# Patient Record
Sex: Female | Born: 1980 | Hispanic: No | Marital: Married | Smoking: Never smoker
Health system: Southern US, Community
[De-identification: ages and names within clinical notes are randomized; demographics above are authoritative.]

## PROBLEM LIST (undated history)

## (undated) DIAGNOSIS — R55 Syncope and collapse: Secondary | ICD-10-CM

## (undated) HISTORY — DX: Syncope and collapse: R55

## (undated) HISTORY — PX: WISDOM TOOTH EXTRACTION: SHX21

## (undated) HISTORY — PX: TUBAL LIGATION: SHX77

## (undated) HISTORY — PX: TONSILLECTOMY: SUR1361

---

## 2006-11-28 ENCOUNTER — Inpatient Hospital Stay: Payer: Self-pay | Admitting: Obstetrics and Gynecology

## 2006-12-02 ENCOUNTER — Ambulatory Visit: Payer: Self-pay | Admitting: Pediatrics

## 2008-02-05 ENCOUNTER — Ambulatory Visit: Payer: Self-pay | Admitting: Family Medicine

## 2009-08-11 ENCOUNTER — Inpatient Hospital Stay: Payer: Self-pay | Admitting: Obstetrics and Gynecology

## 2009-08-17 ENCOUNTER — Ambulatory Visit: Payer: Self-pay | Admitting: Obstetrics and Gynecology

## 2010-05-29 ENCOUNTER — Encounter: Payer: Self-pay | Admitting: Cardiology

## 2010-06-11 ENCOUNTER — Ambulatory Visit: Payer: Self-pay | Admitting: Family Medicine

## 2010-06-11 ENCOUNTER — Encounter: Payer: Self-pay | Admitting: Cardiology

## 2010-06-18 ENCOUNTER — Encounter: Payer: Self-pay | Admitting: Cardiology

## 2010-06-18 ENCOUNTER — Ambulatory Visit: Payer: Self-pay | Admitting: Cardiology

## 2010-06-18 DIAGNOSIS — R55 Syncope and collapse: Secondary | ICD-10-CM

## 2010-06-19 ENCOUNTER — Encounter: Payer: Self-pay | Admitting: Cardiovascular Disease

## 2010-06-19 ENCOUNTER — Ambulatory Visit: Payer: Self-pay | Admitting: Cardiology

## 2010-06-19 ENCOUNTER — Ambulatory Visit: Payer: Self-pay

## 2010-07-03 ENCOUNTER — Ambulatory Visit: Payer: Self-pay | Admitting: Cardiology

## 2010-09-25 NOTE — Assessment & Plan Note (Signed)
Summary: NP6/AMD   Visit Type:  Initial Consult Primary Provider:  Noralee Stain, M.D.  CC:  c/o fainting spells 3 x in the past year.  She states has rapid heart beats prior to when she may faint.  .  History of Present Illness: 30 yo with minimal past medical history presents for evaluation of syncopal episodes.  Patient states that when she was in middle school and high school, she had episodes of syncope.  She does not remember what triggered them but says she tended to be "sick" when it would occur.  She did not have any further episodes in her 67s until this year.  She has had 3 syncopal episodes over the last year.  Each time, she would develop stomach cramping and nausea and would go to the bathroom for a bowel movement.  She would pass out while sitting on the toilet.  She would feel flushed and lightheaded prior to passing out but no palpitations or racing heart beat.  She was pregnant during the first episode.  She cannot think of anything in particular that would have brought on the GI symptoms, and says that after she would recover from passing out, she would vomit and then tend to feel ok.  Episodes were not related to meals. Besides those three episodes this year, she occasionally gets mildly lightheaded if she stands too fast.  She had a RUQ ultrasound recently with unremarkable gallbladder.   Labs (10/11): TSH normal, K 4.3, creatinine 0.73  ECG: NSR, normal  Preventive Screening-Counseling & Management  Caffeine-Diet-Exercise     Does Patient Exercise: no      Drug Use:  no.    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   Vital Signs:  Patient profile:   30 year old female Height:      65 inches Weight:      187 pounds BMI:     31.23 BP supine:   110 / 68  (left arm) BP sitting:   110 / 72  (left arm) BP standing:   110 / 68  (left arm) Cuff size:   large  Vitals Entered By: Bishop Dublin, CMA (June 18, 2010 3:35 PM)  Physical Exam  General:  Well developed, well nourished, in no acute distress. Mildly obese.  Head:  normocephalic and atraumatic Nose:  no deformity, discharge, inflammation, or lesions Mouth:  Teeth, gums and palate normal. Oral mucosa normal. Neck:  Neck supple, no JVD. No masses, thyromegaly or abnormal cervical nodes. Lungs:  Clear bilaterally to auscultation and percussion. Heart:  Non-displaced PMI, chest non-tender; regular rate and rhythm, S1, S2 without murmurs, rubs or gallops. Carotid upstroke normal, no bruit.  Pedals normal pulses. No edema, no varicosities. Abdomen:  Bowel sounds positive; abdomen soft and non-tender without masses, organomegaly, or hernias noted. No hepatosplenomegaly. Msk:  Back normal, normal gait. Muscle strength and tone normal. Extremities:  No clubbing or cyanosis. Neurologic:  Alert and oriented x 3. Skin:  Intact without lesions or rashes. Psych:  Normal affect.   Impression & Recommendations:  Problem # 1:  SYNCOPE (ICD-780.2) Episodes of syncope sound vasovagal (occur with defecation, one of the classic  situations that trigger vasovagal syncope).  I am unsure what is causing the episodes of stomach cramps that seem to trigger these episodes.  ECG and heart exam are normal.  No palpitations or episodes of racing heart rate.  Will get an echo to make sure the heart is structurally normal and a 48 hour holter to make sure no significant arrhythmias are detected.  I talked to her about physical counterpressure measures that sometimes can abort pending vasovagal syncope.  If this cardiac workup is negative, suspect symptoms are vagal.   Other Orders: Echocardiogram (Echo) Holter Monitor (Holter Monitor)  Patient Instructions: 1)  Your physician recommends that you schedule a follow-up appointment in: Follow-up 2 weeks 2)  Your physician has requested that you have an echocardiogram.  Echocardiography is a painless test that uses sound waves to create images of your heart. It provides your doctor with information about the size and shape of your heart and how well your heart's chambers and valves are working.  This procedure takes approximately one hour. There are no restrictions for this procedure. 3)  Your physician has recommended that you wear a holter monitor.  Holter monitors are medical devices that record the heart's electrical activity. Doctors most often use these monitors to diagnose arrhythmias. Arrhythmias are problems with the speed or rhythm of the heartbeat. The monitor is a small, portable device. You can wear one while you do your normal daily activities. This is usually used to diagnose what is causing palpitations/syncope (passing out).

## 2010-09-25 NOTE — Assessment & Plan Note (Signed)
Summary: F2W/AMD   Visit Type:  Follow-up Primary Provider:  Noralee Stain, M.D.  CC:  Has not had any more episodes of syncope.Marland Kitchen  History of Present Illness: 30 yo with minimal past medical history returns for followup of syncopal episodes.  Patient states that when she was in middle school and high school, she had episodes of syncope.  She does not remember what triggered them but says she tended to be "sick" when it would occur.  She did not have any further episodes in her 83s until this year.  She has had 3 syncopal episodes over the last year.  Each time, she would develop stomach cramping and nausea and would go to the bathroom for a bowel movement.  She would pass out while sitting on the toilet.  She would feel flushed and lightheaded prior to passing out but no palpitations or racing heart beat.  She was pregnant during the first episode.  She cannot think of anything in particular that would have brought on the GI symptoms, and says that after she would recover from passing out, she would vomit and then tend to feel ok.  Episodes were not related to meals. Besides those three episodes this year, she occasionally gets mildly lightheaded if she stands too fast.  She had a RUQ ultrasound recently with unremarkable gallbladder.   Since I last saw her, she has had no further symptoms.  Echo showed normal LV systolic and diastolic function, no pulmonary hypertension, and normal valves.  Holter monitor showed occasional runs of ectopic atrial rhythm in the 50s but worrisome arrhythmias.    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  Normal ECG (normal QTc).  Echo (10/11): EF 55-60%, normal diastolic function, normal wall motion, no significant valvular abnormalities, no pulmonary hypertension, normal-appearing RV.  Holter (10/11): Occasional ectopic atrial rhythm with rate in 50s, no worrisome events.  Average HR 83.   Family History: Reviewed history from 06/18/2010 and no changes required. Family History of Diabetes: Mother (Mother's side of family) Grandfather with "heart trouble"  No early CAD, congenital heart disease, or sudden death  Social History: Reviewed history from 06/18/2010 and no changes required. 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 Mildly obese.  Neck:  Neck supple, no JVD. No masses, thyromegaly or abnormal cervical nodes. Lungs:  Clear bilaterally to auscultation and percussion.  Heart:  Non-displaced PMI, chest non-tender; regular rate and rhythm, S1, S2 without murmurs, rubs or gallops. Carotid upstroke normal, no bruit. Pedals normal pulses. No edema, no varicosities. Abdomen:  Bowel sounds positive; abdomen soft and non-tender without masses, organomegaly, or hernias noted. No hepatosplenomegaly. Extremities:  No clubbing or cyanosis. Neurologic:  Alert and  oriented x 3. Psych:  Normal affect.   Impression & Recommendations:  Problem # 1:  SYNCOPE (ICD-780.2) I suspect that the syncopal episodes are vasovagal (occur with defecation, one of the classic situations that trigger vasovagal syncope).  I am unsure what is causing the episodes of stomach cramps that seem to trigger these episodes.  ECG and heart exam are normal.  No palpitations or episodes of racing heart rate. Heart was structurally normal on echo and there were no worrisome arrhythmias on 48 hour holter monitoring.  If episodes become more frequent, would probably have to do a 3 week event monitor to definitively rule out arrhythmic etiology, but I doubt that this is the case.  I talked again to her about physical counterpressure measures that sometimes can abort pending vasovagal syncope.    Patient Instructions: 1)  Your physician recommends that you schedule a follow-up appointment as needed.

## 2010-09-25 NOTE — Assessment & Plan Note (Signed)
Summary: HOLTER/AMD  Nurse Visit

## 2010-09-25 NOTE — Procedures (Signed)
Summary: Summary Report  Summary Report   Imported By: Erle Crocker 07/20/2010 09:02:42  _____________________________________________________________________  External Attachment:    Type:   Image     Comment:   External Document

## 2011-01-21 ENCOUNTER — Ambulatory Visit: Payer: Self-pay | Admitting: Internal Medicine

## 2011-03-19 ENCOUNTER — Encounter: Payer: Self-pay | Admitting: Cardiology

## 2011-06-14 IMAGING — US ABDOMEN ULTRASOUND LIMITED
1 series · 17 of 25 positions shown · non-contrast
Comparison: none

REASON FOR EXAM: ABD PAIN NAUSEA
COMMENTS:

[Series 1: abdomen ultrasound limited · 17 of 82 slices shown]
[im 1/82]
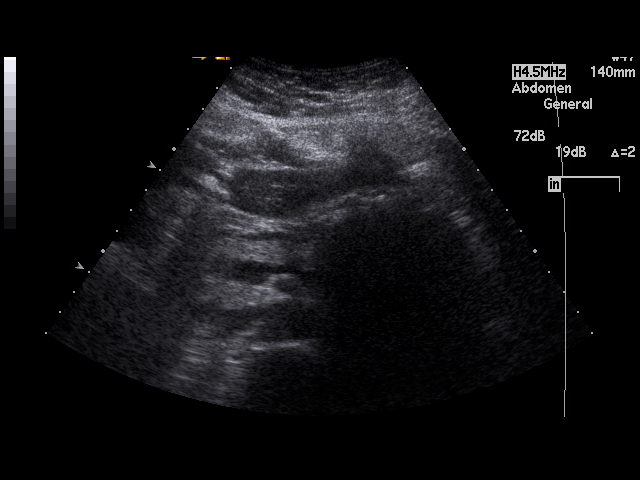
[im 7/82]
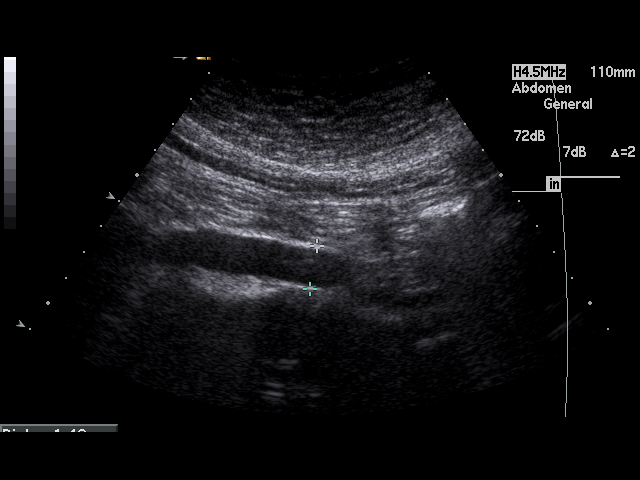
[im 11/82]
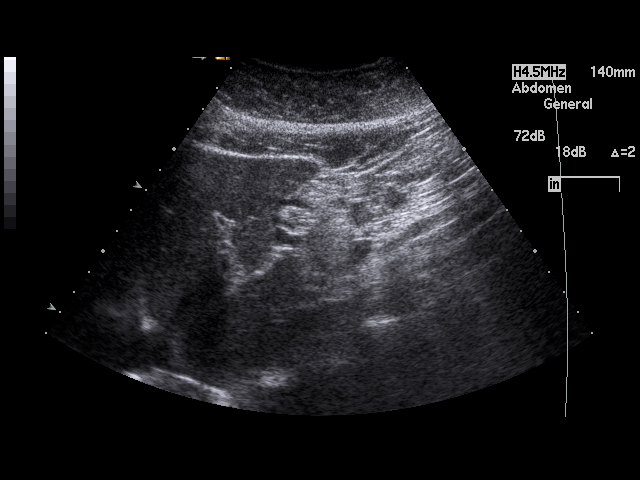
[im 17/82]
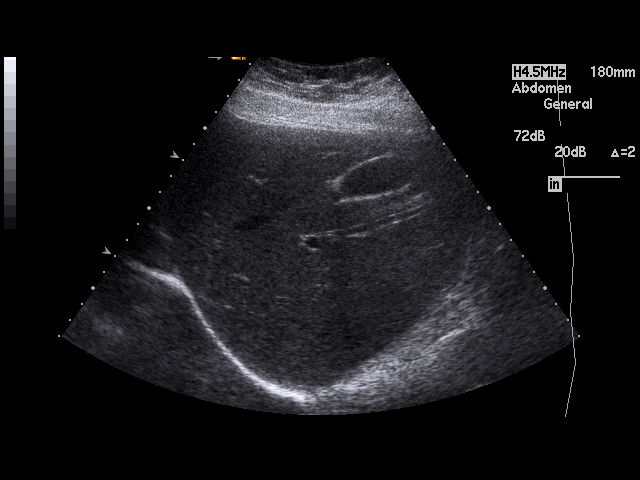
[im 21/82]
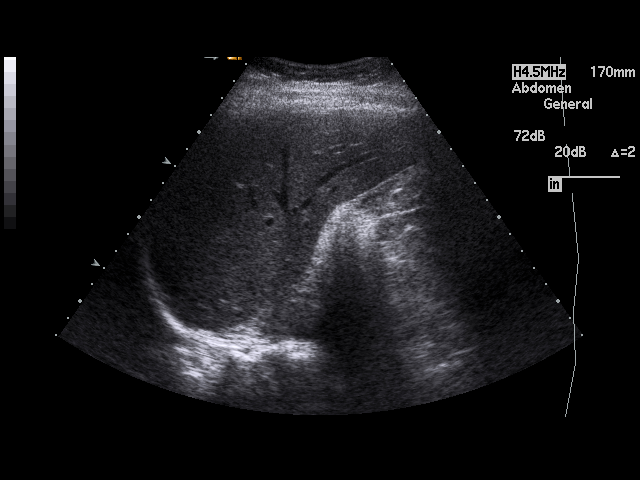
[im 28/82]
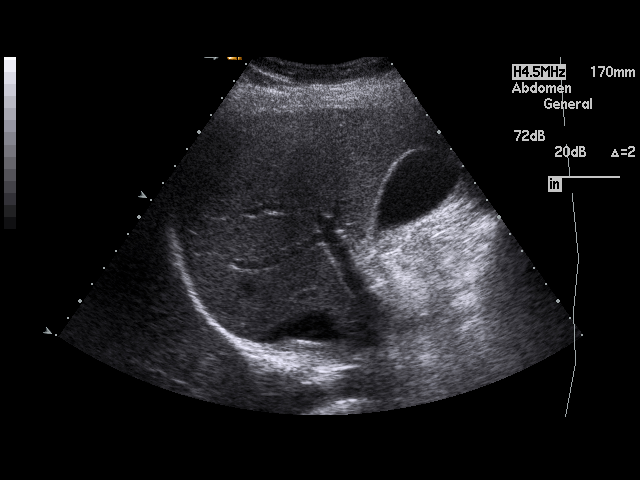
[im 31/82]
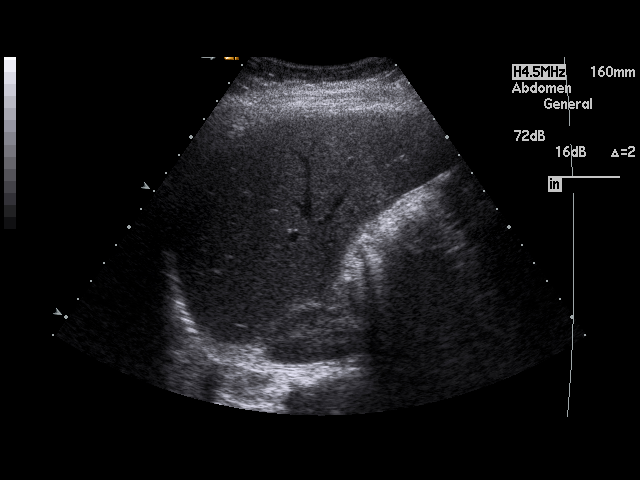
[im 38/82]
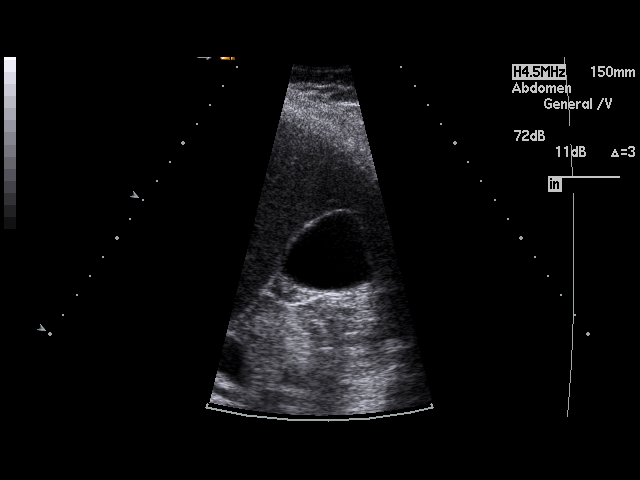
[im 41/82]
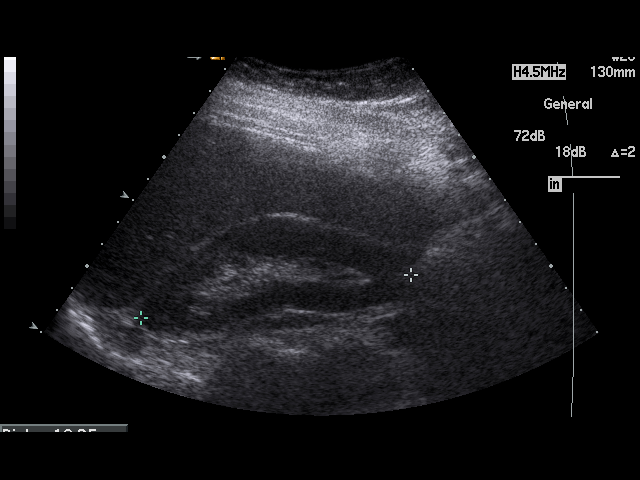
[im 44/82]
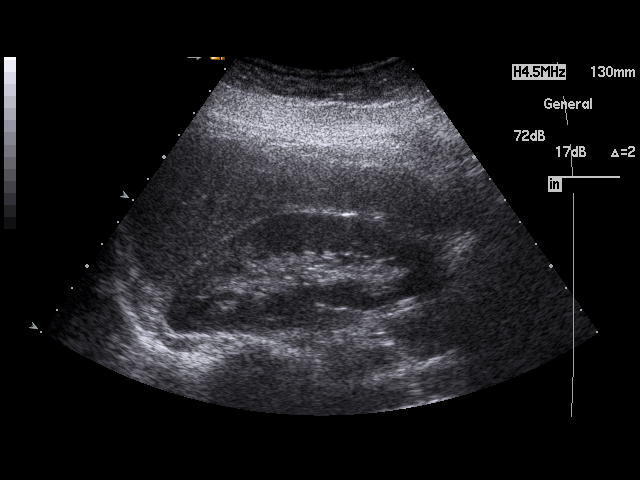
[im 51/82]
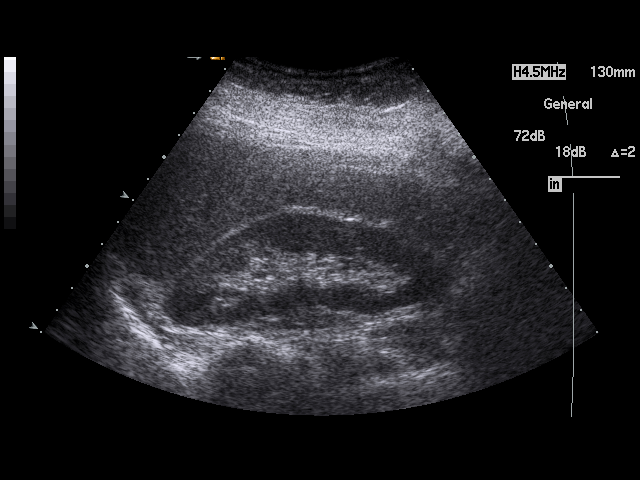
[im 55/82]
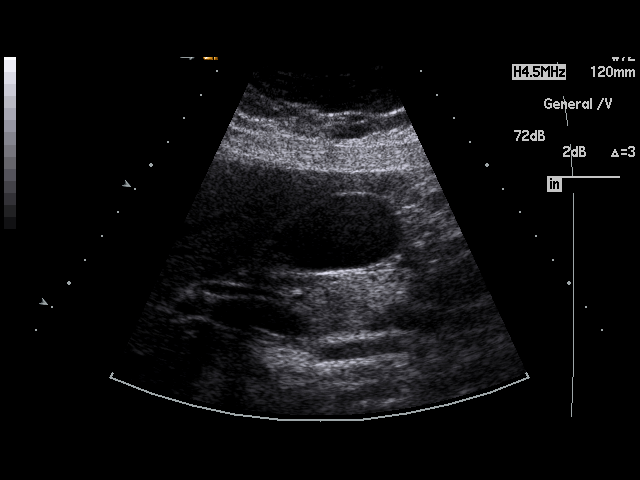
[im 61/82]
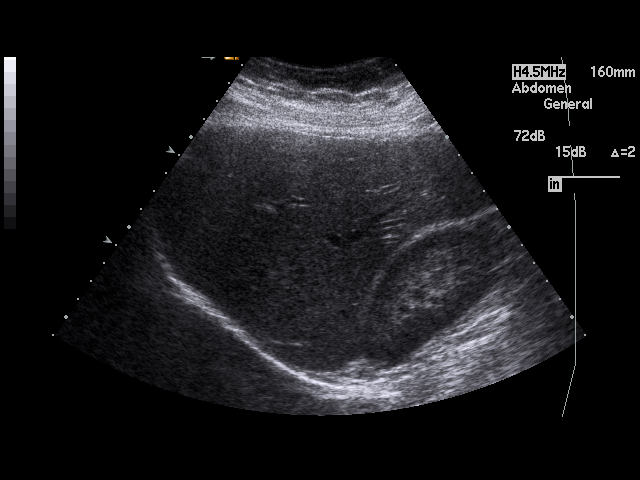
[im 65/82]
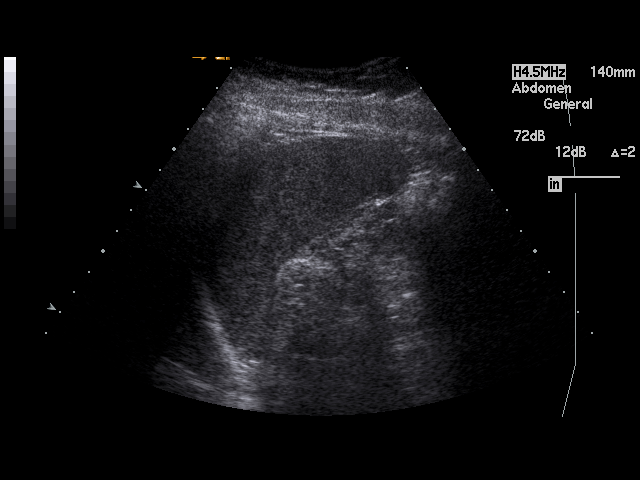
[im 71/82]
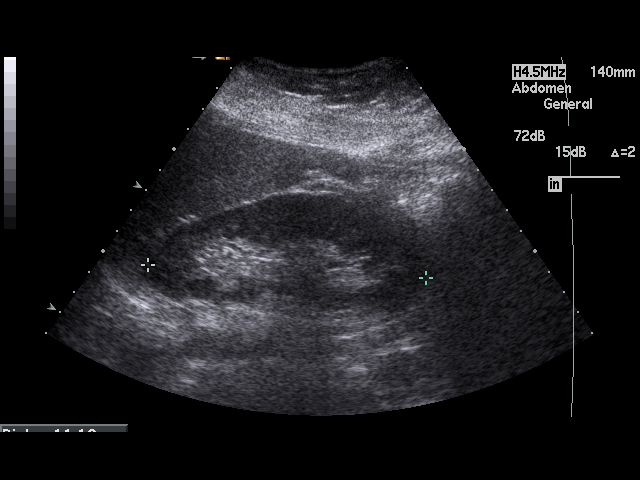
[im 75/82]
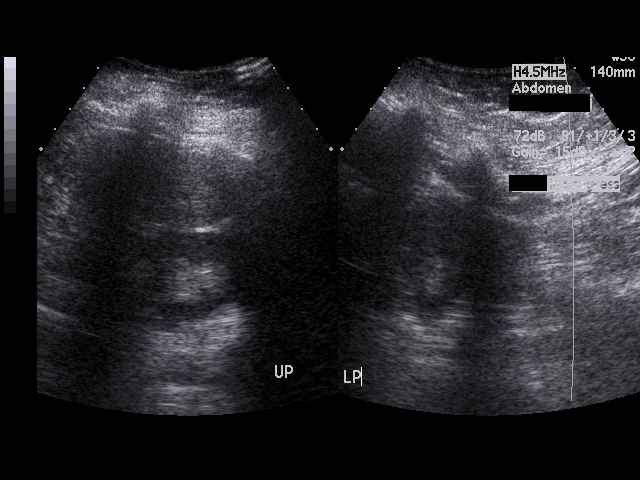
[im 82/82]
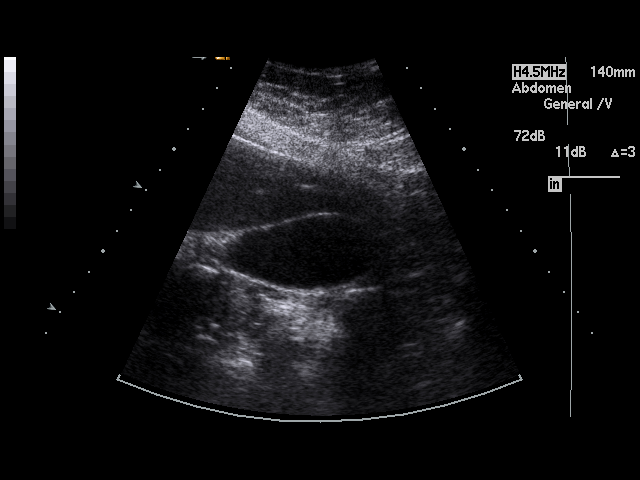

[17 of 25 positions shown; findings below may reference images not displayed]

PROCEDURE:     DON LOLITO - DON LOLITO ABDOMEN UPPER GENERAL  - June 11, 2010  [DATE]

RESULT:     The liver, spleen, abdominal aorta and inferior vena cava show
no significant abnormalities. A portion of the pancreatic tail is obscured
by bowel gas but otherwise the pancreas is normal in appearance. No
gallstones are seen. There is no thickening of the gallbladder wall. Common
bile duct measures 3.8 mm at maximum diameter which is within normal limits.
The kidneys show no hydronephrosis. The right kidney measures 10.38 cm
sagittally and the left kidney measures 11.19 cm sagittally. No ascites is
seen.
IMPRESSION: No significant abnormalities are noted.

## 2011-07-30 ENCOUNTER — Ambulatory Visit: Payer: Self-pay

## 2013-06-28 ENCOUNTER — Ambulatory Visit: Payer: Self-pay | Admitting: Obstetrics and Gynecology

## 2013-07-26 ENCOUNTER — Ambulatory Visit: Payer: Self-pay | Admitting: Obstetrics and Gynecology

## 2013-08-18 ENCOUNTER — Ambulatory Visit: Payer: Self-pay | Admitting: Obstetrics and Gynecology

## 2013-08-18 LAB — CBC WITH DIFFERENTIAL/PLATELET
Basophil #: 0 10*3/uL (ref 0.0–0.1)
Basophil %: 0.4 %
Eosinophil #: 0.1 10*3/uL (ref 0.0–0.7)
HGB: 11.7 g/dL — ABNORMAL LOW (ref 12.0–16.0)
Lymphocyte #: 2 10*3/uL (ref 1.0–3.6)
Lymphocyte %: 22.4 %
MCH: 28.2 pg (ref 26.0–34.0)
MCV: 90 fL (ref 80–100)
Monocyte #: 0.5 x10 3/mm (ref 0.2–0.9)
Monocyte %: 5.9 %
Neutrophil #: 6.3 10*3/uL (ref 1.4–6.5)
Neutrophil %: 70.5 %

## 2013-08-21 ENCOUNTER — Inpatient Hospital Stay: Payer: Self-pay | Admitting: Obstetrics and Gynecology

## 2014-12-16 NOTE — Op Note (Signed)
PATIENT NAME:  Mallory Clayton, Mallory Clayton MR#:  [id number] DATE OF BIRTH:  [birth year]  DATE OF PROCEDURE:  08/21/2013  PREOPERATIVE DIAGNOSES:  1. A 39.3-week intrauterine pregnancy undelivered.  2. Gestational diabetes mellitus, diet controlled.  3. History of postpartum hemorrhage. 4. History of prior fetal macrosomia.   POSTOPERATIVE DIAGNOSES:  1. A 39.3-week intrauterine pregnancy, delivered.  2. Gestational diabetes mellitus, diet controlled.  3. History of postpartum hemorrhage.  4. Prior fetal macrosomia.  5. Viable female, 9 pounds 10 ounces.   OPERATIVE PROCEDURE: Primary low cervical transverse cesarean section.   SURGEON: Prentice Docker. Briceida Rasberry, MD  FIRST ASSISTANT: None.   ANESTHESIA: Spinal.   INDICATIONS: The patient is a 34 year old white female, gravida 4, para 2-0-1-2 at 21.3 weeks' gestation who presents for elective primary low cervical transverse cesarean section. The patient had decided to opt out of vaginal childbirth due to her past history of fetal macrosomia with postpartum hemorrhage at vaginal delivery secondary to laceration of multiple vulvar varicosities. The patient also had history of fetal macrosomia. This infant is large by Leopold's as well as estimated fetal weight by ultrasound 2 weeks ago. She also has gestational diabetes, which was diet controlled.   FINDINGS AT SURGERY: Revealed a viable female infant, 9 pounds 10 ounces, having Apgars of 7 and 9 at one and five minutes, respectively. Uterus, tubes and ovaries were grossly normal.   DESCRIPTION OF THE PROCEDURE: The patient was brought to the operating room, where she was placed in the sitting position. Spinal anesthetic was introduced without difficulty. The patient was placed in the supine position with a right lateral hip roll placed. A ChloraPrep abdominal and perineal prep and drape was performed in standard fashion. Foley catheter had been placed and was draining clear yellow urine. After checking for  adequate level of anesthesia, a Pfannenstiel incision was made in the abdomen. The fascia was incised transversely. The midline raphe was incised, separated and the peritoneum was entered. Bladder flap was created through the lower uterine segment with Metzenbaum's. Low transverse incision was made in the uterus, and this was extended bluntly in a cephalad and caudad direction through expansion. The infant was delivered through a vertex presentation. He was vigorous at birth. The oropharynx and nasopharynx were bulb suctioned on the abdominal wall. The infant was delivered in a standard technique. The umbilical cord was doubly clamped and cut, and the infant was handed off to the awaiting resuscitation team. The cord blood sampling was obtained. Placenta was expressed from the uterus. The uterus was externalized onto the anterior abdominal wall and was cleared of all debris with laps. The incision was closed in 2 layers using #1 chromic suture. First layer was a running locking stitch. The second layer was an imbricating layer. The uterus, tubes and ovaries were noted to be grossly normal. The uterus was placed back into the abdominopelvic cavity. Gutters were cleared of all debris with laps. The incision was then closed in layers with 0 Maxon being used on the fascia in a simple running manner. The subcutaneous tissues were reapproximated using 2-0 Vicryl continuous suture. The skin was closed with a 4-0 Vicryl continuous running closure. Dermabond glue was placed over the incision. A pressure dressing was placed. The patient was then mobilized and taken to the recovery room in satisfactory condition.   ESTIMATED BLOOD LOSS: 750 mL.   INTRAVENOUS FLUIDS: 1800 mL of crystalloid.   URINE OUTPUT: 250 mL.   ANTIBIOTICS: The patient did receive Ancef antibiotic prophylaxis.  COUNTS: All instrument, needle and sponge counts were verified as correct.   ____________________________ Prentice DockerMartin A. Alveta Quintela,  MD mad:lb D: 08/21/2013 09:16:09 ET T: 08/21/2013 09:32:39 ET JOB#: [id number]  cc: Daphine DeutscherMartin A. Josia Cueva, MD, <Dictator> [facility] Prentice DockerMARTIN A Carlisle Enke MD ELECTRONICALLY SIGNED 08/21/2013 10:43

## 2015-05-09 ENCOUNTER — Encounter: Payer: Self-pay | Admitting: Obstetrics and Gynecology

## 2015-05-09 ENCOUNTER — Ambulatory Visit (INDEPENDENT_AMBULATORY_CARE_PROVIDER_SITE_OTHER): Payer: 59 | Admitting: Obstetrics and Gynecology

## 2015-05-09 VITALS — BP 95/69 | HR 91 | Ht 65.0 in | Wt 184.3 lb

## 2015-05-09 DIAGNOSIS — R638 Other symptoms and signs concerning food and fluid intake: Secondary | ICD-10-CM | POA: Insufficient documentation

## 2015-05-09 MED ORDER — JUNEL FE 1/20 1-20 MG-MCG PO TABS
1.0000 | ORAL_TABLET | Freq: Every day | ORAL | Status: DC
Start: 2015-05-09 — End: 2016-06-13

## 2015-05-09 NOTE — Patient Instructions (Signed)
.   1.  No Pap smear is needed until 2018. 2.  No mammogram is needed until age 34. 3.  Birth control pills are refilled. 4.  Healthy eating and exercise habits are encouraged.  In order to reduce weight to a more normal body mass index 5.  Return in 1 year

## 2015-05-09 NOTE — Progress Notes (Signed)
Patient ID: Mallory Clayton, female   DOB: 05-20-1981, 34 y.o.   MRN: [id number] ANNUAL PREVENTATIVE CARE GYN  ENCOUNTER NOTE  Subjective:       Mallory Clayton is a 34 y.o. No obstetric history on file. female here for a routine annual gynecologic exam.  Current complaints: 1.  none    Gynecologic History Patient's last menstrual period was 04/19/2015 (exact date). Contraception: OCP (estrogen/progesterone) Last Pap: 05/04/2014 neg/neg. Results were: normal.  Next Pap smear due in 2018. Last mammogram: n/a. Results were: n/a  Obstetric History Para 3013  Past Medical History  Diagnosis Date  . Syncope     suspect vesovagal. normal ECG and QTc. echo 10/11: EF 55-60%, normal diastolic dysfunction, normal wall motion, no significant valvular abnm., no pulmonary htn, normal appearing RV. holter 10/11; occassional eptopic atrial rhythm with rate in 50s, no worrisome events, average HR 83.    Past Surgical History  Procedure Laterality Date  . Cesarean section  2014  . Wisdom tooth extraction      No current outpatient prescriptions on file prior to visit.   No current facility-administered medications on file prior to visit.    No Known Allergies  Social History   Social History  . Marital Status: Married    Spouse Name: N/A  . Number of Children: N/A  . Years of Education: N/A   Occupational History  . Not on file.   Social History Main Topics  . Smoking status: Never Smoker   . Smokeless tobacco: Not on file     Comment: non smoker  . Alcohol Use: No  . Drug Use: No  . Sexual Activity: Yes    Birth Control/ Protection: Pill   Other Topics Concern  . 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     Family History  Problem Relation Age of Onset  . Diabetes Mother     side of her family  . Coronary artery disease Neg Hx     congenital heart disease, sudden death   . Ovarian  cancer Neg Hx   . Heart disease      GF - heart trouble   . Colon cancer Maternal Aunt   . Breast cancer Maternal Aunt   . Colon cancer Maternal Uncle     The following portions of the patient's history were reviewed and updated as appropriate: allergies, current medications, past family history, past medical history, past social history, past surgical history and problem list.  Review of Systems ROS Review of Systems - General ROS: negative for - chills, fatigue, fever, hot flashes, night sweats, weight gain or weight loss Psychological ROS: negative for - anxiety, decreased libido, depression, mood swings, physical abuse or sexual abuse Ophthalmic ROS: negative for - blurry vision, eye pain or loss of vision ENT ROS: negative for - headaches, hearing change, visual changes or vocal changes Allergy and Immunology ROS: negative for - hives, itchy/watery eyes or seasonal allergies Hematological and Lymphatic ROS: negative for - bleeding problems, bruising, swollen lymph nodes or weight loss Endocrine ROS: negative for - galactorrhea, hair pattern changes, hot flashes, malaise/lethargy, mood swings, palpitations, polydipsia/polyuria, skin changes, temperature intolerance or unexpected weight changes Breast ROS: negative for - new or changing breast lumps or nipple discharge Respiratory ROS: negative for - cough or shortness of breath Cardiovascular ROS: negative for - chest pain, irregular heartbeat, palpitations or shortness of breath  Gastrointestinal ROS: no abdominal pain, change in bowel habits, or black or bloody stools Genito-Urinary ROS: no dysuria, trouble voiding, or hematuria Musculoskeletal ROS: negative for - joint pain or joint stiffness Neurological ROS: negative for - bowel and bladder control changes Dermatological ROS: negative for rash and skin lesion changes   Objective:   BP 95/69 mmHg  Pulse 91  Ht  (1.651 m)  Wt 184 lb 4.8 oz (83.598 kg)  BMI 30.67 kg/m2   LMP 04/19/2015 (Exact Date) CONSTITUTIONAL: Well-developed, well-nourished female in no acute distress.  PSYCHIATRIC: Normal mood and affect. Normal behavior. Normal judgment and thought content. NEUROLGIC: Alert and oriented to person, place, and time. Normal muscle tone coordination. No cranial nerve deficit noted. HENT:  Normocephalic, atraumatic, External right and left ear normal. Oropharynx is clear and moist EYES: Conjunctivae and EOM are normal. Pupils are equal, round, and reactive to light. No scleral icterus.  NECK: Normal range of motion, supple, no masses.  Normal thyroid.  SKIN: Skin is warm and dry. No rash noted. Not diaphoretic. No erythema. No pallor. CARDIOVASCULAR: Normal heart rate noted, regular rhythm, no murmur. RESPIRATORY: Clear to auscultation bilaterally. Effort and breath sounds normal, no problems with respiration noted. BREASTS: Symmetric in size. No masses, skin changes, nipple drainage, or lymphadenopathy. ABDOMEN: Soft, normal bowel sounds, no distention noted.  No tenderness, rebound or guarding.  BLADDER: Normal PELVIC:  External Genitalia: Normal  BUS: Normal  Vagina: Normal; narrowed introitus  Cervix: Normal  Uterus: Normal; midplane, normal size, shape, mobile, nontender  Adnexa: Normal  RV: External Exam NormaI  MUSCULOSKELETAL: Normal range of motion. No tenderness.  No cyanosis, clubbing, or edema.  2+ distal pulses. LYMPHATIC: No Axillary, Supraclavicular, or Inguinal Adenopathy.    Assessment:   Annual gynecologic examination 34 y.o. Contraception: OCP (estrogen/progesterone) Overweight and Obesity 1   Plan:  Pap: Not needed Mammogram: Not Indicated Stool Guaiac Testing:  Not Indicated Labs: will call back if desires routine labs Routine preventative health maintenance measures emphasized: Exercise/Diet/Weight control, Tobacco Warnings and Alcohol/Substance use risks Gentle reminder to lose 10 pounds over 1-2 years in order to get to  a normal body mass index Return to Clinic - [address] [place], CMA  Herold Harms, MD

## 2016-04-16 ENCOUNTER — Ambulatory Visit (INDEPENDENT_AMBULATORY_CARE_PROVIDER_SITE_OTHER): Payer: 59 | Admitting: Family Medicine

## 2016-04-16 VITALS — BP 112/58 | HR 73 | Temp 98.7°F | Ht 65.0 in | Wt 196.0 lb

## 2016-04-16 DIAGNOSIS — J039 Acute tonsillitis, unspecified: Secondary | ICD-10-CM

## 2016-04-16 LAB — POCT RAPID STREP A (OFFICE): Rapid Strep A Screen: NEGATIVE

## 2016-04-16 MED ORDER — MAGIC MOUTHWASH W/LIDOCAINE: 5.0000 mL | Freq: Three times a day (TID) | ORAL | 0 refills | Status: DC | PRN

## 2016-04-16 MED ORDER — PENICILLIN V POTASSIUM 500 MG PO TABS: 500.0000 mg | ORAL_TABLET | Freq: Three times a day (TID) | ORAL | 0 refills | Status: DC

## 2016-04-16 NOTE — Progress Notes (Signed)
Subjective:    Patient ID: Mallory Clayton, female    DOB: Aug 28, 1980, 35 y.o.   MRN: [id number]  HPI: Mallory Clayton is a 35 y.o. female presenting on 04/16/2016 for Sore Throat (painful swollen gland cough getting improved onset 3 days pt had laryngitis week ago)   HPI  Pt presents for sore throat. Had URI with laryngitis 2 weeks ago. Symptoms resolved- lingering cough. Sore throat returned yesterday. Puss on the tonsils yesterday. Hurts to swallow. R sided lymphadenopathy. No fever. Took aleve. Mild relief.   Past Medical History:  Diagnosis Date  . Syncope    suspect vesovagal. normal ECG and QTc. echo 10/11: EF 55-60%, normal diastolic dysfunction, normal wall motion, no significant valvular abnm., no pulmonary htn, normal appearing RV. holter 10/11; occassional eptopic atrial rhythm with rate in 50s, no worrisome events, average HR 83.    Current Outpatient Prescriptions on File Prior to Visit  Medication Sig  . JUNEL FE 1/20 1-20 MG-MCG tablet Take 1 tablet by mouth daily.  . Multiple Vitamins-Minerals (MULTIVITAMIN WITH MINERALS) tablet Take 1 tablet by mouth daily.   No current facility-administered medications on file prior to visit.     Review of Systems  Constitutional: Negative for chills and fever.  HENT: Positive for sore throat. Negative for congestion, drooling, trouble swallowing and voice change.   Respiratory: Negative for chest tightness, shortness of breath, wheezing and stridor.   Cardiovascular: Negative for chest pain and palpitations.  Gastrointestinal: Negative.   Hematological: Positive for adenopathy.   Per HPI unless specifically indicated above     Objective:    BP (!) 112/58 (BP Location: Left Arm, Patient Position: Sitting, Cuff Size: Normal)   Pulse 73   Temp 98.7 F (37.1 C) (Oral)   Ht 5\' 5"  (1.651 m)   Wt 196 lb (88.9 kg)   BMI 32.62 kg/m   Wt Readings from Last 3 Encounters:  04/16/16 196 lb (88.9 kg)  05/09/15 184 lb 4.8 oz  (83.6 kg)  07/03/10 (!) 86 lb (39 kg)    Physical Exam  HENT:  Head: Normocephalic and atraumatic.  Right Ear: Hearing and tympanic membrane normal.  Left Ear: Hearing and tympanic membrane normal.  Nose: No mucosal edema or rhinorrhea. Right sinus exhibits no maxillary sinus tenderness and no frontal sinus tenderness. Left sinus exhibits no maxillary sinus tenderness and no frontal sinus tenderness.  Mouth/Throat: Uvula is midline and mucous membranes are normal. Oropharyngeal exudate and posterior oropharyngeal erythema present. No tonsillar abscesses.    Lymphadenopathy:       Head (right side): Submandibular and tonsillar adenopathy present.       Head (left side): Submandibular and tonsillar adenopathy present.    She has cervical adenopathy.       Right cervical: Superficial cervical adenopathy present.   Results for orders placed or performed in visit on 04/16/16  POCT rapid strep A  Result Value Ref Range   Rapid Strep A Screen Negative Negative      Assessment & Plan:   Problem List Items Addressed This Visit    None    Visit Diagnoses    Exudative tonsillitis    -  Primary   Treat with Abx given recent viral infection with second worsening. Signs of abscess reviewed. Will consider monospot testing if adenopathy and exudate do not resolved with abx treatment.    Relevant Medications   penicillin v potassium (VEETID) 500 MG tablet   magic mouthwash w/lidocaine SOLN  Other Relevant Orders   POCT rapid strep A (Completed)      Meds ordered this encounter  Medications  . penicillin v potassium (VEETID) 500 MG tablet    Sig: Take 1 tablet (500 mg total) by mouth 3 (three) times daily. For 10 days.    Dispense:  30 tablet    Refill:  0    Order Specific Question:   Supervising Provider    Answer:   Janeann ForehandHAWKINS JR, JAMES H [phone][[id number]]  . magic mouthwash w/lidocaine SOLN    Sig: Take 5 mLs by mouth 3 (three) times daily as needed for mouth pain.    Dispense:  120 mL     Refill:  0    Order Specific Question:   Supervising Provider    Answer:   Janeann ForehandHAWKINS JR, JAMES H [[id number]]      Follow up plan: Return if symptoms worsen or fail to improve.

## 2016-04-16 NOTE — Patient Instructions (Addendum)

## 2016-04-19 ENCOUNTER — Telehealth: Payer: Self-pay

## 2016-04-19 DIAGNOSIS — R591 Generalized enlarged lymph nodes: Secondary | ICD-10-CM

## 2016-04-19 DIAGNOSIS — J039 Acute tonsillitis, unspecified: Secondary | ICD-10-CM

## 2016-04-19 NOTE — Telephone Encounter (Signed)
Patient aware.[place] 

## 2016-04-19 NOTE — Telephone Encounter (Signed)
Just a lab test. She can come in today or give it over the weekend and come Monday. There is no treatment for mono other than rest.

## 2016-04-19 NOTE — Telephone Encounter (Signed)
Patient called reporting that her glands are still swollen.  You told her that if glands were still swollen by Friday that she needs have a mono test.  Does patient need appt or just lab?

## 2016-04-20 LAB — MONONUCLEOSIS SCREEN: HETEROPHILE, MONO SCREEN: NEGATIVE

## 2016-05-13 NOTE — Progress Notes (Deleted)
ANNUAL PREVENTATIVE CARE GYN  ENCOUNTER NOTE  Subjective:       Mallory Clayton is a 35 y.o. No obstetric history on file. female here for a routine annual gynecologic exam.  Current complaints: 1.  none    Gynecologic History No LMP recorded. Contraception: OCP (estrogen/progesterone) Last Pap: 04/2014 neg/neg. Results were: normal Last mammogram: n/a. Results were: n/a  Obstetric History OB History  No data available    Past Medical History:  Diagnosis Date  . Syncope    suspect vesovagal. normal ECG and QTc. echo 10/11: EF 55-60%, normal diastolic dysfunction, normal wall motion, no significant valvular abnm., no pulmonary htn, normal appearing RV. holter 10/11; occassional eptopic atrial rhythm with rate in 50s, no worrisome events, average HR 83.    Past Surgical History:  Procedure Laterality Date  . CESAREAN SECTION  2014  . WISDOM TOOTH EXTRACTION      Current Outpatient Prescriptions on File Prior to Visit  Medication Sig Dispense Refill  . JUNEL FE 1/20 1-20 MG-MCG tablet Take 1 tablet by mouth daily. 1 Package 11  . magic mouthwash w/lidocaine SOLN Take 5 mLs by mouth 3 (three) times daily as needed for mouth pain. 120 mL 0  . Multiple Vitamins-Minerals (MULTIVITAMIN WITH MINERALS) tablet Take 1 tablet by mouth daily.    . penicillin v potassium (VEETID) 500 MG tablet Take 1 tablet (500 mg total) by mouth 3 (three) times daily. For 10 days. 30 tablet 0   No current facility-administered medications on file prior to visit.     No Known Allergies  Social History   Social History  . Marital status: Married    Spouse name: N/A  . Number of children: N/A  . Years of education: N/A   Occupational History  . Not on file.   Social History Main Topics  . Smoking status: Never Smoker  . Smokeless tobacco: Not on file     Comment: non smoker  . Alcohol use No  . Drug use: No  . Sexual activity: Yes    Birth control/ protection: Pill   Other Topics Concern   . 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     Family History  Problem Relation Age of Onset  . Diabetes Mother     side of her family  . Coronary artery disease Neg Hx     congenital heart disease, sudden death   . Ovarian cancer Neg Hx   . Heart disease      GF - heart trouble   . Colon cancer Maternal Aunt   . Breast cancer Maternal Aunt   . Colon cancer Maternal Uncle     The following portions of the patient's history were reviewed and updated as appropriate: allergies, current medications, past family history, past medical history, past social history, past surgical history and problem list.  Review of Systems ROS Review of Systems - General ROS: negative for - chills, fatigue, fever, hot flashes, night sweats, weight gain or weight loss Psychological ROS: negative for - anxiety, decreased libido, depression, mood swings, physical abuse or sexual abuse Ophthalmic ROS: negative for - blurry vision, eye pain or loss of vision ENT ROS: negative for - headaches, hearing change, visual changes or vocal changes Allergy and Immunology ROS: negative for - hives, itchy/watery eyes or seasonal allergies Hematological and Lymphatic ROS: negative for - bleeding problems, bruising, swollen lymph nodes  or weight loss Endocrine ROS: negative for - galactorrhea, hair pattern changes, hot flashes, malaise/lethargy, mood swings, palpitations, polydipsia/polyuria, skin changes, temperature intolerance or unexpected weight changes Breast ROS: negative for - new or changing breast lumps or nipple discharge Respiratory ROS: negative for - cough or shortness of breath Cardiovascular ROS: negative for - chest pain, irregular heartbeat, palpitations or shortness of breath Gastrointestinal ROS: no abdominal pain, change in bowel habits, or black or bloody stools Genito-Urinary ROS: no dysuria, trouble voiding, or  hematuria Musculoskeletal ROS: negative for - joint pain or joint stiffness Neurological ROS: negative for - bowel and bladder control changes Dermatological ROS: negative for rash and skin lesion changes   Objective:   There were no vitals taken for this visit. CONSTITUTIONAL: Well-developed, well-nourished female in no acute distress.  PSYCHIATRIC: Normal mood and affect. Normal behavior. Normal judgment and thought content. NEUROLGIC: Alert and oriented to person, place, and time. Normal muscle tone coordination. No cranial nerve deficit noted. HENT:  Normocephalic, atraumatic, External right and left ear normal. Oropharynx is clear and moist EYES: Conjunctivae and EOM are normal. Pupils are equal, round, and reactive to light. No scleral icterus.  NECK: Normal range of motion, supple, no masses.  Normal thyroid.  SKIN: Skin is warm and dry. No rash noted. Not diaphoretic. No erythema. No pallor. CARDIOVASCULAR: Normal heart rate noted, regular rhythm, no murmur. RESPIRATORY: Clear to auscultation bilaterally. Effort and breath sounds normal, no problems with respiration noted. BREASTS: Symmetric in size. No masses, skin changes, nipple drainage, or lymphadenopathy. ABDOMEN: Soft, normal bowel sounds, no distention noted.  No tenderness, rebound or guarding.  BLADDER: Normal PELVIC:  External Genitalia: Normal  BUS: Normal  Vagina: Normal  Cervix: Normal  Uterus: Normal  Adnexa: Normal  RV: {Blank multiple:19196::"External Exam NormaI","No Rectal Masses","Normal Sphincter tone"}  MUSCULOSKELETAL: Normal range of motion. No tenderness.  No cyanosis, clubbing, or edema.  2+ distal pulses. LYMPHATIC: No Axillary, Supraclavicular, or Inguinal Adenopathy.    Assessment:   Annual gynecologic examination 35 y.o. Contraception: OCP (estrogen/progesterone) bmi-30 Problem List Items Addressed This Visit    Increased body mass index    Other Visit Diagnoses    Well woman exam with  routine gynecological exam    -  Primary      Plan:  Pap: due 2018 Mammogram: Not Indicated Stool Guaiac Testing:  Not Indicated Labs: ? Routine preventative health maintenance measures emphasized: {Blank multiple:19196::"Exercise/Diet/Weight control","Tobacco Warnings","Alcohol/Substance use risks","Stress Management","Peer Pressure Issues","Safe Sex"} *** Return to [address] - [address]   Kaeden Mester RemerMiller, [place]

## 2016-05-14 ENCOUNTER — Encounter: Payer: 59 | Admitting: Obstetrics and Gynecology

## 2016-06-13 ENCOUNTER — Ambulatory Visit (INDEPENDENT_AMBULATORY_CARE_PROVIDER_SITE_OTHER): Payer: 59 | Admitting: Obstetrics and Gynecology

## 2016-06-13 ENCOUNTER — Encounter: Payer: Self-pay | Admitting: Obstetrics and Gynecology

## 2016-06-13 VITALS — BP 101/68 | HR 88 | Ht 65.0 in | Wt 195.6 lb

## 2016-06-13 DIAGNOSIS — Z3041 Encounter for surveillance of contraceptive pills: Secondary | ICD-10-CM

## 2016-06-13 DIAGNOSIS — R638 Other symptoms and signs concerning food and fluid intake: Secondary | ICD-10-CM

## 2016-06-13 DIAGNOSIS — Z01419 Encounter for gynecological examination (general) (routine) without abnormal findings: Secondary | ICD-10-CM | POA: Diagnosis not present

## 2016-06-13 MED ORDER — NORETHIN ACE-ETH ESTRAD-FE 1-20 MG-MCG PO TABS: 1.0000 | ORAL_TABLET | Freq: Every day | ORAL | 4 refills | Status: DC

## 2016-06-13 NOTE — Progress Notes (Signed)
ANNUAL PREVENTATIVE CARE GYN  ENCOUNTER NOTE  Subjective:       Mallory Clayton is a 35 y.o. [phone] female here for a routine annual gynecologic exam.  Current complaints: 1.  none   Weight tends to fluctuate; she is up 10 pounds from last year. Menstrual cycles are normal. Bowel and bladder function are normal.   Gynecologic History Patient's last menstrual period was 06/10/2016. Contraception: OCP (estrogen/progesterone) Last Pap: 04/2014 neg/neg. Results were: normal Last mammogram: n/a. Results were: Marland Kitchen.  Obstetric History OB History  Gravida Para Term Preterm AB Living  4 2 2   1 3   SAB TAB Ectopic Multiple Live Births  1       3    # Outcome Date GA Lbr Len/2nd Weight Sex Delivery Anes PTL Lv  4 Term 2014   9 lb 1.6 oz (4.128 kg) M CS-LTranv   LIV  3 SAB 2013          2 Gravida 2010   8 lb 1.6 oz (3.674 kg) M Vag-Spont   LIV  1 Term 2008   7 lb 11.2 oz (3.493 kg) M Vag-Spont   LIV      Past Medical History:  Diagnosis Date  . Syncope    suspect vesovagal. normal ECG and QTc. echo 10/11: EF 55-60%, normal diastolic dysfunction, normal wall motion, no significant valvular abnm., no pulmonary htn, normal appearing RV. holter 10/11; occassional eptopic atrial rhythm with rate in 50s, no worrisome events, average HR 83.    Past Surgical History:  Procedure Laterality Date  . CESAREAN SECTION  2014  . WISDOM TOOTH EXTRACTION      Current Outpatient Prescriptions on File Prior to Visit  Medication Sig Dispense Refill  . JUNEL FE 1/20 1-20 MG-MCG tablet Take 1 tablet by mouth daily. 1 Package 11  . Multiple Vitamins-Minerals (MULTIVITAMIN WITH MINERALS) tablet Take 1 tablet by mouth daily.     No current facility-administered medications on file prior to visit.     No Known Allergies  Social History   Social History  . Marital status: Married    Spouse name: N/A  . Number of children: N/A  . Years of education: N/A   Occupational History  . Not on file.    Social History Main Topics  . Smoking status: Never Smoker  . Smokeless tobacco: Not on file     Comment: non smoker  . Alcohol use No  . Drug use: No  . Sexual activity: Yes    Birth control/ protection: Pill   Other Topics Concern  . 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 Does not get regular exercise.     Family History  Problem Relation Age of Onset  . Diabetes Mother     side of her family  . Heart disease      GF - heart trouble   . Colon cancer Maternal Aunt   . Breast cancer Maternal Aunt   . Colon cancer Maternal Uncle   . Coronary artery disease Neg Hx     congenital heart disease, sudden death   . Ovarian cancer Neg Hx     The following portions of the patient's history were reviewed and updated as appropriate: allergies, current medications, past family history, past medical history, past social history, past surgical history and problem list.  Review of Systems ROS Review of Systems - General ROS: negative for - chills,  fatigue, fever, hot flashes, night sweats, weight gain or weight loss Psychological ROS: negative for - anxiety, decreased libido, depression, mood swings, physical abuse or sexual abuse Ophthalmic ROS: negative for - blurry vision, eye pain or loss of vision ENT ROS: negative for - headaches, hearing change, visual changes or vocal changes Allergy and Immunology ROS: negative for - hives, itchy/watery eyes or seasonal allergies Hematological and Lymphatic ROS: negative for - bleeding problems, bruising, swollen lymph nodes or weight loss Endocrine ROS: negative for - galactorrhea, hair pattern changes, hot flashes, malaise/lethargy, mood swings, palpitations, polydipsia/polyuria, skin changes, temperature intolerance or unexpected weight changes Breast ROS: negative for - new or changing breast lumps or nipple discharge Respiratory ROS: negative for - cough or shortness of  breath Cardiovascular ROS: negative for - chest pain, irregular heartbeat, palpitations or shortness of breath Gastrointestinal ROS: no abdominal pain, change in bowel habits, or black or bloody stools Genito-Urinary ROS: no dysuria, trouble voiding, or hematuria Musculoskeletal ROS: negative for - joint pain or joint stiffness Neurological ROS: negative for - bowel and bladder control changes Dermatological ROS: negative for rash and skin lesion changes   Objective:   BP 101/68   Pulse 88   Ht 5\' 5"  (1.651 m)   Wt 195 lb 9.6 oz (88.7 kg)   LMP 06/10/2016   BMI 32.55 kg/m  CONSTITUTIONAL: Well-developed, well-nourished female in no acute distress.  PSYCHIATRIC: Normal mood and affect. Normal behavior. Normal judgment and thought content. NEUROLGIC: Alert and oriented to person, place, and time. Normal muscle tone coordination. No cranial nerve deficit noted. HENT:  Normocephalic, atraumatic EYES: Conjunctivae and EOM are normal. No scleral icterus.  NECK: Normal range of motion, supple, no masses.  Normal thyroid.  SKIN: Skin is warm and dry. No rash noted. Not diaphoretic. No erythema. No pallor. CARDIOVASCULAR: Normal heart rate noted, regular rhythm, no murmur. RESPIRATORY: Clear to auscultation bilaterally. Effort and breath sounds normal, no problems with respiration noted. BREASTS: Symmetric in size. No masses, skin changes, nipple drainage, or lymphadenopathy. ABDOMEN: Soft, normal bowel sounds, no distention noted.  No tenderness, rebound or guarding.  BLADDER: Normal PELVIC:  External Genitalia: Normal  BUS: Normal  Vagina: Normal; Menstrual blood in vault  Cervix: Normal; no lesions  Uterus: Normal; midplane, top normal size, mobile, nontender  Adnexa: Normal ; nonpalpable and nontender  RV: External Exam NormaI  MUSCULOSKELETAL: Normal range of motion. No tenderness.  No cyanosis, clubbing, or edema.  2+ distal pulses. LYMPHATIC: No Axillary, Supraclavicular, or  Inguinal Adenopathy.    Assessment:   Annual gynecologic examination 35 y.o. Contraception: OCP (estrogen/progesterone) bmi- 32 Problem List Items Addressed This Visit    None    Visit Diagnoses   None.     Plan:  Pap: due 2018 Mammogram: Not Indicated Stool Guaiac Testing:  Not Indicated Labs: Lipid 1, FBS, TSH, Hemoglobin A1C and Vit D Level"". Routine preventative health maintenance measures emphasized: Exercise/Diet/Weight control, Tobacco Warnings and Alcohol/Substance use risks  Birth control pills refilled Return to Clinic - 1 [address] [place], CMA  Herold Harms, MD  Note: This dictation was prepared with Dragon dictation along with smaller phrase technology. Any transcriptional errors that result from this process are unintentional.  '

## 2016-06-13 NOTE — Patient Instructions (Signed)
1. Birth control pills are refilled 2. No Pap smear 3. Continue self breast exam awareness 4. Continue with healthy eating and exercise with a weight loss goal of 10 pounds 5. Screening labs are ordered 6. Return in 1 year

## 2016-06-14 LAB — HEMOGLOBIN A1C
ESTIMATED AVERAGE GLUCOSE: 97 mg/dL
Hgb A1c MFr Bld: 5 % (ref 4.8–5.6)

## 2016-06-14 LAB — GLUCOSE, RANDOM: GLUCOSE: 95 mg/dL (ref 65–99)

## 2016-06-14 LAB — TSH: TSH: 0.792 u[IU]/mL (ref 0.450–4.500)

## 2016-06-14 LAB — VITAMIN D 25 HYDROXY (VIT D DEFICIENCY, FRACTURES): Vit D, 25-Hydroxy: 28.3 ng/mL — ABNORMAL LOW (ref 30.0–100.0)

## 2016-06-14 LAB — LIPID PANEL
CHOLESTEROL TOTAL: 212 mg/dL — AB (ref 100–199)
Chol/HDL Ratio: 3.3 ratio units (ref 0.0–4.4)
HDL: 65 mg/dL (ref >39)
LDL Calculated: 132 mg/dL — ABNORMAL HIGH (ref 0–99)
Triglycerides: 74 mg/dL (ref 0–149)
VLDL Cholesterol Cal: 15 mg/dL (ref 5–40)

## 2016-06-17 ENCOUNTER — Ambulatory Visit (INDEPENDENT_AMBULATORY_CARE_PROVIDER_SITE_OTHER): Payer: Self-pay

## 2016-06-17 ENCOUNTER — Ambulatory Visit
Admission: EM | Admit: 2016-06-17 | Discharge: 2016-06-17 | Disposition: A | Discharge status: Home / Self Care | Payer: Self-pay | Attending: Family Medicine | Admitting: Family Medicine

## 2016-06-17 ENCOUNTER — Encounter: Payer: Self-pay | Admitting: Emergency Medicine

## 2016-06-17 DIAGNOSIS — S93402A Sprain of unspecified ligament of left ankle, initial encounter: Secondary | ICD-10-CM

## 2016-06-17 MED ORDER — HYDROCODONE-ACETAMINOPHEN 5-325 MG PO TABS: ORAL_TABLET | ORAL | 0 refills | Status: DC

## 2016-06-17 NOTE — ED Provider Notes (Signed)
[facility]    CSN: [id number] Arrival date & time: 06/17/16  1641     History   Chief Complaint Chief Complaint  Patient presents with  . Ankle Pain    HPI Mallory Clayton is a 35 y.o. female.   35 yo female with a c/o left ankle pain and swelling since yesterday after rolling her ankle.    The history is provided by the patient.  Ankle Pain    Past Medical History:  Diagnosis Date  . Syncope    suspect vesovagal. normal ECG and QTc. echo 10/11: EF 55-60%, normal diastolic dysfunction, normal wall motion, no significant valvular abnm., no pulmonary htn, normal appearing RV. holter 10/11; occassional eptopic atrial rhythm with rate in 50s, no worrisome events, average HR 83.    Patient Active Problem List   Diagnosis Date Noted  . Increased body mass index 05/09/2015  . SYNCOPE 06/18/2010    Past Surgical History:  Procedure Laterality Date  . CESAREAN SECTION  2014  . WISDOM TOOTH EXTRACTION      OB History    Gravida Para Term Preterm AB Living   4 2 2   1 3    SAB TAB Ectopic Multiple Live Births   1       3       Home Medications    Prior to Admission medications   Medication Sig Start Date End Date Taking? Authorizing Provider  HYDROcodone-acetaminophen (NORCO/VICODIN) 5-325 MG tablet 1-2 tabs po q 8 hours prn 06/17/16   Mallory Mccallumrlando Cru Kritikos, MD  Multiple Vitamins-Minerals (MULTIVITAMIN WITH MINERALS) tablet Take 1 tablet by mouth daily.    Historical Provider, MD  norethindrone-ethinyl estradiol (JUNEL FE 1/20) 1-20 MG-MCG tablet Take 1 tablet by mouth daily. 06/13/16   Herold HarmsMartin A Defrancesco, MD    Family History Family History  Problem Relation Age of Onset  . Diabetes Mother     side of her family  . Heart disease      GF - heart trouble   . Colon cancer Maternal Aunt   . Breast cancer Maternal Aunt   . Colon cancer Maternal Uncle   . Coronary artery disease Neg Hx     congenital heart disease, sudden death   . Ovarian cancer Neg  Hx     Social History Social History  Substance Use Topics  . Smoking status: Never Smoker  . Smokeless tobacco: Never Used     Comment: non smoker  . Alcohol use No     Allergies   Review of patient's allergies indicates no known allergies.   Review of Systems Review of Systems   Physical Exam Triage Vital Signs ED Triage Vitals  Enc Vitals Group     BP 06/17/16 1657 122/73     Pulse Rate 06/17/16 1657 66     Resp 06/17/16 1657 16     Temp 06/17/16 1657 98.4 F (36.9 C)     Temp Source 06/17/16 1657 Oral     SpO2 06/17/16 1657 100 %     Weight 06/17/16 1656 190 lb (86.2 kg)     Height 06/17/16 1656 5\' 5"  (1.651 m)     Head Circumference --      Peak Flow --      Pain Score 06/17/16 1657 7     Pain Loc --      Pain Edu? --      Excl. in GC? --    No data found.   Updated Vital  Signs BP 122/73 (BP Location: Left Arm)   Pulse 66   Temp 98.4 F (36.9 C) (Oral)   Resp 16   Ht 5\' 5"  (1.651 m)   Wt 190 lb (86.2 kg)   LMP 06/10/2016 (Exact Date) Comment: denies preg  SpO2 100%   BMI 31.62 kg/m   Visual Acuity Right Eye Distance:   Left Eye Distance:   Bilateral Distance:    Right Eye Near:   Left Eye Near:    Bilateral Near:     Physical Exam  Constitutional: She appears well-developed and well-nourished. No distress.  Musculoskeletal:       Left ankle: She exhibits swelling. She exhibits normal range of motion, no ecchymosis, no deformity, no laceration and normal pulse. Tenderness. Lateral malleolus and AITFL tenderness found. No medial malleolus, no CF ligament, no posterior TFL, no head of 5th metatarsal and no proximal fibula tenderness found. Achilles tendon normal.  Skin: She is not diaphoretic.  Nursing note and vitals reviewed.    UC Treatments / Results  Labs (all labs ordered are listed, but only abnormal results are displayed) Labs Reviewed - No data to display  EKG  EKG Interpretation None       Radiology Dg Ankle Complete  Left  Result Date: 06/17/2016 CLINICAL DATA:  Twisting injury yesterday. Bilateral pain with lateral swelling. EXAM: LEFT ANKLE COMPLETE - 3+ VIEW COMPARISON:  None. FINDINGS: The mineralization and alignment are normal. There is no evidence of acute fracture or dislocation. The joint spaces are maintained. There is moderate lateral soft tissue swelling. Scattered dermal calcifications are present within the lower leg. IMPRESSION: No acute osseous findings.  Lateral soft tissue swelling. Electronically Signed   By: Carey Bullocks M.D.   On: 06/17/2016 17:17    Procedures Procedures (including critical care time)  Medications Ordered in UC Medications - No data to display   Initial Impression / Assessment and Plan / UC Course  I have reviewed the triage vital signs and the nursing notes.  Pertinent labs & imaging results that were available during my care of the patient were reviewed by me and considered in my medical decision making (see chart for details).  Clinical Course     Final Clinical Impressions(s) / UC Diagnoses   Final diagnoses:  Sprain of left ankle, unspecified ligament, initial encounter    New Prescriptions Discharge Medication List as of 06/17/2016  5:31 PM    START taking these medications   Details  HYDROcodone-acetaminophen (NORCO/VICODIN) 5-325 MG tablet 1-2 tabs po q 8 hours prn, Print       1. x-ray results and diagnosis reviewed with patient 2. rx as per orders above; reviewed possible side effects, interactions, risks and benefits  3. Recommend supportive treatment with rest, ice, elevation, otc analgesics  4. Follow-up prn if symptoms worsen or don't improve   Mallory Mccallum, MD 06/17/16 1807

## 2016-06-17 NOTE — ED Triage Notes (Signed)
Patient c/o pain in her left ankle after rolling her ankle yesterday.

## 2016-06-18 ENCOUNTER — Ambulatory Visit: Payer: 59 | Admitting: Family Medicine

## 2017-05-19 ENCOUNTER — Encounter: Payer: Self-pay | Admitting: Obstetrics and Gynecology

## 2017-05-23 ENCOUNTER — Encounter: Payer: Self-pay | Admitting: Obstetrics and Gynecology

## 2017-05-23 ENCOUNTER — Ambulatory Visit (INDEPENDENT_AMBULATORY_CARE_PROVIDER_SITE_OTHER): Payer: 59 | Admitting: Obstetrics and Gynecology

## 2017-05-23 VITALS — BP 110/74 | HR 71 | Ht 65.0 in | Wt 205.3 lb

## 2017-05-23 DIAGNOSIS — Z3201 Encounter for pregnancy test, result positive: Secondary | ICD-10-CM | POA: Diagnosis not present

## 2017-05-23 DIAGNOSIS — Z8632 Personal history of gestational diabetes: Secondary | ICD-10-CM | POA: Insufficient documentation

## 2017-05-23 DIAGNOSIS — O3661X0 Maternal care for excessive fetal growth, first trimester, not applicable or unspecified: Secondary | ICD-10-CM | POA: Diagnosis not present

## 2017-05-23 DIAGNOSIS — Z862 Personal history of diseases of the blood and blood-forming organs and certain disorders involving the immune mechanism: Secondary | ICD-10-CM | POA: Diagnosis not present

## 2017-05-23 DIAGNOSIS — N912 Amenorrhea, unspecified: Secondary | ICD-10-CM | POA: Diagnosis not present

## 2017-05-23 DIAGNOSIS — Z98891 History of uterine scar from previous surgery: Secondary | ICD-10-CM

## 2017-05-23 DIAGNOSIS — N926 Irregular menstruation, unspecified: Secondary | ICD-10-CM | POA: Diagnosis not present

## 2017-05-23 DIAGNOSIS — Z8759 Personal history of other complications of pregnancy, childbirth and the puerperium: Secondary | ICD-10-CM | POA: Insufficient documentation

## 2017-05-23 DIAGNOSIS — IMO0001 Reserved for inherently not codable concepts without codable children: Secondary | ICD-10-CM

## 2017-05-23 DIAGNOSIS — O219 Vomiting of pregnancy, unspecified: Secondary | ICD-10-CM | POA: Diagnosis not present

## 2017-05-23 LAB — POCT URINE PREGNANCY: Preg Test, Ur: POSITIVE — AB

## 2017-05-23 MED ORDER — DOXYLAMINE-PYRIDOXINE 10-10 MG PO TBEC: 4.0000 | DELAYED_RELEASE_TABLET | Freq: Every day | ORAL | 1 refills | Status: DC

## 2017-05-23 NOTE — Progress Notes (Signed)
GYN ENCOUNTER NOTE  Subjective:  PREGNANCY CONFIRMATION        Mallory Clayton is a 36 y.o. G22P2013 female is here for gynecologic evaluation of the following issues:  1. Amenorrhea.    LMP 04/04/2017 (certain); history of irregular cycles EDD 01/09/2018 EGA 7.0 weeks  Patient reports significant nausea without vomiting. She denies vaginal bleeding, vaginal discharge, or pelvic pain.  Prenatal risk factors: History of fetal macrosomia History of postpartum hemorrhage and vaginal delivery secondary to laceration of multiple vulvar varicosities, 8 lbs. 14 oz. baby Obesity 1-BMI 34 History of prior cesarean section, 9 lbs. 10 oz. History of syncope, suspected vasovagal History of gestational diabetes, diet controlled Advanced maternal age-36 years old   Gynecologic History Patient's last menstrual period was 04/04/2017 (exact date). Contraception: none Last Pap: due Last mammogram: NA  Obstetric History OB History  Gravida Para Term Preterm AB Living  SAB TAB Ectopic Multiple Live Births  1       3    # Outcome Date GA Lbr Len/2nd Weight Sex Delivery Anes PTL Lv  5 Current           4 Term 2014   9 lb 1.6 oz (4.128 kg) M CS-LTranv   LIV  3 SAB 2013          2 Gravida 2010   8 lb 1.6 oz (3.674 kg) M Vag-Spont   LIV  1 Term 2008   7 lb 11.2 oz (3.493 kg) M Vag-Spont   LIV      Past Medical History:  Diagnosis Date  . Syncope    suspect vesovagal. normal ECG and QTc. echo 10/11: EF 55-60%, normal diastolic dysfunction, normal wall motion, no significant valvular abnm., no pulmonary htn, normal appearing RV. holter 10/11; occassional eptopic atrial rhythm with rate in 50s, no worrisome events, average HR 83.    Past Surgical History:  Procedure Laterality Date  . CESAREAN SECTION  2014  . WISDOM TOOTH EXTRACTION      Current Outpatient Prescriptions on File Prior to Visit  Medication Sig Dispense Refill  . Multiple Vitamins-Minerals (MULTIVITAMIN WITH  MINERALS) tablet Take 1 tablet by mouth daily.     No current facility-administered medications on file prior to visit.     No Known Allergies  Social History   Social History  . Marital status: Married    Spouse name: N/A  . Number of children: N/A  . Years of education: N/A   Occupational History  . Not on file.   Social History Main Topics  . Smoking status: Never Smoker  . Smokeless tobacco: Never Used     Comment: non smoker  . Alcohol use No  . Drug use: No  . Sexual activity: Yes    Birth control/ protection: None   Other Topics Concern  . 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     Family History  Problem Relation Age of Onset  . Diabetes Mother        side of her family  . Heart disease Unknown        GF - heart trouble   . Colon cancer Maternal Aunt   . Breast cancer Maternal Aunt   . Colon cancer Maternal Uncle   . Coronary artery disease Neg Hx        congenital heart disease, sudden  death   . Ovarian cancer Neg Hx     The following portions of the patient's history were reviewed and updated as appropriate: allergies, current medications, past family history, past medical history, past social history, past surgical history and problem list.  Review of Systems Review of Systems - Comprehensive review of systems is negative except for that noted in the history of present illness  Objective:   BP 110/74   Pulse 71   Ht  (1.651 m)   Wt 205 lb 4.8 oz (93.1 kg)   LMP 04/04/2017 (Exact Date)   BMI 34.16 kg/m  Physical exam-deferred    Assessment:   1. Amenorrhea - POCT urine pregnancy  2. Irregular periods - US OB Comp Less 14 Wks; Future - US OB Transvaginal; Future  3. History of cesarean section  4. History of postpartum hemorrhage  5. Fetal macrosomia in first trimester, single or unspecified fetus  6. Positive urine pregnancy test  7.  History of gestational diabetes  8. Nausea/vomiting in pregnancy  9. Advanced maternal age     Plan:   1. Prenatal vitamins daily 2. Diclegis for nausea and vomiting 3. Patient is a possible TOLAC candidate; repeat C-section versus trial of labor was discussed and she will decide at a later date with regards to what she would like to pursue. 4. Ultrasound is scheduled to confirm EDD due to irregular menstrual cycle 5. Patient will need early Glucola at 18 weeks due to prior history of gestational diabetes and current maternal obesity 6. Genetic testing due to age was addressed. Patient is to consider options. 7.NewOB nursing intake in 2 weeks 8. NewOB history and physical 5 weeks 9.New OB counseling: The patient has been given an overview regarding routine prenatal care. Recommendations regarding diet, weight gain, and exercise in pregnancy were given. Prenatal testing, optional genetic testing, and ultrasound use in pregnancy were reviewed.  Benefits of Breast Feeding were discussed. The patient is encouraged to consider nursing her baby post partum.  A total of 25 minutes were spent face-to-face with the patient during this encounter and over half of that time involved counseling and coordination of care.  Herold Harms, MD  Note: This dictation was prepared with Dragon dictation along with smaller phrase technology. Any transcriptional errors that result from this process are unintentional.

## 2017-05-23 NOTE — Patient Instructions (Addendum)
1. Continue prenatal vitamins 2. DICLEGIS is prescribed for nausea; 2 tablets at night and one twice a day 3. Pelvic ultrasound 1-2 weeks to confirm EDD due to irregular menstrual cycle 4. New OB nursing intake 2 weeks 5. New OB history and physical-5 weeks  Morning Sickness Morning sickness is when you feel sick to your stomach (nauseous) during pregnancy. This nauseous feeling may or may not come with vomiting. It often occurs in the morning but can be a problem any time of day. Morning sickness is most common during the first trimester, but it may continue throughout pregnancy. While morning sickness is unpleasant, it is usually harmless unless you develop severe and continual vomiting (hyperemesis gravidarum). This condition requires more intense treatment. What are the causes? The cause of morning sickness is not completely known but seems to be related to normal hormonal changes that occur in pregnancy. What increases the risk? You are at greater risk if you:  Experienced nausea or vomiting before your pregnancy.  Had morning sickness during a previous pregnancy.  Are pregnant with more than one baby, such as twins.  How is this treated? Do not use any medicines (prescription, over-the-counter, or herbal) for morning sickness without first talking to your health care provider. Your health care provider may prescribe or recommend:  Vitamin B6 supplements.  Anti-nausea medicines.  The herbal medicine ginger.  Follow these instructions at home:  Only take over-the-counter or prescription medicines as directed by your health care provider.  Taking multivitamins before getting pregnant can prevent or decrease the severity of morning sickness in most women.  Eat a piece of dry toast or unsalted crackers before getting out of bed in the morning.  Eat five or six small meals a day.  Eat dry and bland foods (rice, baked potato). Foods high in carbohydrates are often helpful.  Do  not drink liquids with your meals. Drink liquids between meals.  Avoid greasy, fatty, and spicy foods.  Get someone to cook for you if the smell of any food causes nausea and vomiting.  If you feel nauseous after taking prenatal vitamins, take the vitamins at night or with a snack.  Snack on protein foods (nuts, yogurt, cheese) between meals if you are hungry.  Eat unsweetened gelatins for desserts.  Wearing an acupressure wristband (worn for sea sickness) may be helpful.  Acupuncture may be helpful.  Do not smoke.  Get a humidifier to keep the air in your house free of odors.  Get plenty of fresh air. Contact a health care provider if:  Your home remedies are not working, and you need medicine.  You feel dizzy or lightheaded.  You are losing weight. Get help right away if:  You have persistent and uncontrolled nausea and vomiting.  You pass out (faint). This information is not intended to replace advice given to you by your health care provider. Make sure you discuss any questions you have with your health care provider. Document Released: 10/03/2006 Document Revised: 01/18/2016 Document Reviewed: 01/27/2013 Elsevier Interactive Patient Education  2017 [employer].

## 2017-06-02 ENCOUNTER — Encounter: Payer: Self-pay | Admitting: Obstetrics and Gynecology

## 2017-06-03 ENCOUNTER — Encounter: Payer: 59 | Admitting: Obstetrics and Gynecology

## 2017-06-06 ENCOUNTER — Ambulatory Visit (INDEPENDENT_AMBULATORY_CARE_PROVIDER_SITE_OTHER): Payer: 59 | Admitting: Obstetrics and Gynecology

## 2017-06-06 ENCOUNTER — Ambulatory Visit (INDEPENDENT_AMBULATORY_CARE_PROVIDER_SITE_OTHER): Payer: 59

## 2017-06-06 VITALS — BP 107/66 | HR 80 | Ht 65.0 in | Wt 209.0 lb

## 2017-06-06 DIAGNOSIS — N926 Irregular menstruation, unspecified: Secondary | ICD-10-CM

## 2017-06-06 DIAGNOSIS — Z1389 Encounter for screening for other disorder: Secondary | ICD-10-CM | POA: Diagnosis not present

## 2017-06-06 DIAGNOSIS — Z3481 Encounter for supervision of other normal pregnancy, first trimester: Secondary | ICD-10-CM | POA: Diagnosis not present

## 2017-06-06 DIAGNOSIS — E669 Obesity, unspecified: Secondary | ICD-10-CM | POA: Diagnosis not present

## 2017-06-06 DIAGNOSIS — Z113 Encounter for screening for infections with a predominantly sexual mode of transmission: Secondary | ICD-10-CM

## 2017-06-06 NOTE — Progress Notes (Signed)
Mallory Clayton presents for NOB nurse interview visit. Pregnancy confirmation done 05/23/2017, UPT: positive. Provider: Dr. Greggory Keen.   LMP: 04/04/2017. Ultrasound today is consistent with EDD: 01/09/2018. G-5  P-2013.  Pregnancy education material explained and given.  No cats in the home.  NOB labs ordered. TSH/HbgA1c due to Increased BMI-32.  HIV labs and Drug screen were explained optional and she did not decline. Drug screen ordered. PNV encouraged. Genetic screening options discussed. Genetic testing: Unsure.  Pt may discuss with provider. Pt continues to c/o nausea and the Diclegsis is not helping and also makes her sleepy. Pt will try Vitamin B6 alone to see if this help. Pt to contact provider if nausea worsens to the point were she may need something else. Is following recommendation with diet. Pt. To follow up with provider on 06/24/2017 (as previously scheduled)  for NOB physical.  All questions answered.

## 2017-06-06 NOTE — Patient Instructions (Signed)
Pregnancy and Zika Virus Disease Zika virus disease, or Zika, is an illness that can spread to people from mosquitoes that carry the virus. It may also spread from person to person through infected body fluids. Zika first occurred in [place], but recently it has spread to new areas. The virus occurs in tropical climates. The location of Zika continues to change. Most people who become infected with Zika virus do not develop serious illness. However, Zika may cause birth defects in an unborn baby whose mother is infected with the virus. It may also increase the risk of miscarriage. What are the symptoms of Zika virus disease? In many cases, people who have been infected with Zika virus do not develop any symptoms. If symptoms appear, they usually start about a week after the person is infected. Symptoms are usually mild. They may include:  Fever.  Rash.  Red eyes.  Joint pain.  How does Zika virus disease spread? The main way that Zika virus spreads is through the bite of a certain type of mosquito. Unlike most types of mosquitos, which bite only at night, the type of mosquito that carries Zika virus bites both at night and during the day. Zika virus can also spread through sexual contact, through a blood transfusion, and from a mother to her baby before or during birth. Once you have had Zika virus disease, it is unlikely that you will get it again. Can I pass Zika to my baby during pregnancy? Yes, Zika can pass from a mother to her baby before or during birth. What problems can Zika cause for my baby? A woman who is infected with Zika virus while pregnant is at risk of having her baby born with a condition in which the brain or head is smaller than expected (microcephaly). Babies who have microcephaly can have developmental delays, seizures, hearing problems, and vision problems. Having Zika virus disease during pregnancy can also increase the risk of miscarriage. How can Zika virus disease be  prevented? There is no vaccine to prevent Zika. The best way to prevent the disease is to avoid infected mosquitoes and avoid exposure to body fluids that can spread the virus. Avoid any possible exposure to Zika by taking the following precautions. For women and their sex partners:  Avoid traveling to high-risk areas. The locations where Zika is being reported change often. To identify high-risk areas, check the CDC travel website: www.cdc.gov/zika/geo/index.html  If you or your sex partner must travel to a high-risk area, talk with a health care provider before and after traveling.  Take all precautions to avoid mosquito bites if you live in, or travel to, any of the high-risk areas. Insect repellents are safe to use during pregnancy.  Ask your health care provider when it is safe to have sexual contact.  For women:  If you are pregnant or trying to become pregnant, avoid sexual contact with persons who may have been exposed to Zika virus, persons who have possible symptoms of Zika, or persons whose history you are unsure about. If you choose to have sexual contact with someone who may have been exposed to Zika virus, use condoms correctly during the entire duration of sexual activity, every time. Do not share sexual devices, as you may be exposed to body fluids.  Ask your health care provider about when it is safe to attempt pregnancy after a possible exposure to Zika virus.  What steps should I take to avoid mosquito bites? Take these steps to avoid mosquito bites   when you are in a high-risk area:  Wear loose clothing that covers your arms and legs.  Limit your outdoor activities.  Do not open windows unless they have window screens.  Sleep under mosquito nets.  Use insect repellent. The best insect repellents have:  DEET, picaridin, oil of lemon eucalyptus (OLE), or IR3535 in them.  Higher amounts of an active ingredient in them.  Remember that insect repellents are safe to  use during pregnancy.  Do not use OLE on children who are younger than 3 years of age. Do not use insect repellent on babies who are younger than 2 months of age.  Cover your child's stroller with mosquito netting. Make sure the netting fits snugly and that any loose netting does not cover your child's mouth or nose. Do not use a blanket as a mosquito-protection cover.  Do not apply insect repellent underneath clothing.  If you are using sunscreen, apply the sunscreen before applying the insect repellent.  Treat clothing with permethrin. Do not apply permethrin directly to your skin. Follow label directions for safe use.  Get rid of standing water, where mosquitoes may reproduce. Standing water is often found in items such as buckets, bowls, animal food dishes, and flowerpots.  When you return from traveling to any high-risk area, continue taking actions to protect yourself against mosquito bites for 3 weeks, even if you show no signs of illness. This will prevent spreading Zika virus to uninfected mosquitoes. What should I know about the sexual transmission of Zika? People can spread Zika to their sexual partners during vaginal, anal, or oral sex, or by sharing sexual devices. Many people with Zika do not develop symptoms, so a person could spread the disease without knowing that they are infected. The greatest risk is to women who are pregnant or who may become pregnant. Zika virus can live longer in semen than it can live in blood. Couples can prevent sexual transmission of the virus by:  Using condoms correctly during the entire duration of sexual activity, every time. This includes vaginal, anal, and oral sex.  Not sharing sexual devices. Sharing increases your risk of being exposed to body fluid from another person.  Avoiding all sexual activity until your health care provider says it is safe.  Should I be tested for Zika virus? A sample of your blood can be tested for Zika virus. A  pregnant woman should be tested if she may have been exposed to the virus or if she has symptoms of Zika. She may also have additional tests done during her pregnancy, such ultrasound testing. Talk with your health care provider about which tests are recommended. This information is not intended to replace advice given to you by your health care provider. Make sure you discuss any questions you have with your health care provider. Document Released: 05/03/2015 Document Revised: 01/18/2016 Document Reviewed: 04/26/2015 Elsevier Interactive Patient Education  2018 [employer] Hyperemesis Gravidarum Hyperemesis gravidarum is a severe form of nausea and vomiting that happens during pregnancy. Hyperemesis is worse than morning sickness. It may cause you to have nausea or vomiting all day for many days. It may keep you from eating and drinking enough food and liquids. Hyperemesis usually occurs during the first half (the first 20 weeks) of pregnancy. It often goes away once a woman is in her second half of pregnancy. However, sometimes hyperemesis continues through an entire pregnancy. What are the causes? The cause of this condition is not known. It may be related   to changes in chemicals (hormones) in the body during pregnancy, such as the high level of pregnancy hormone (human chorionic gonadotropin) or the increase in the female sex hormone (estrogen). What are the signs or symptoms? Symptoms of this condition include:  Severe nausea and vomiting.  Nausea that does not go away.  Vomiting that does not allow you to keep any food down.  Weight loss.  Body fluid loss (dehydration).  Having no desire to eat, or not liking food that you have previously enjoyed.  How is this diagnosed? This condition may be diagnosed based on:  A physical exam.  Your medical history.  Your symptoms.  Blood tests.  Urine tests.  How is this treated? This condition may be managed with medicine. If  medicines to do not help relieve nausea and vomiting, you may need to receive fluids through an IV tube at the hospital. Follow these instructions at home:  Take over-the-counter and prescription medicines only as told by your health care provider.  Avoid iron pills and multivitamins that contain iron for the first 3-4 months of pregnancy. If you take prescription iron pills, do not stop taking them unless your health care provider approves.  Take the following actions to help prevent nausea and vomiting: ? In the morning, before getting out of bed, try eating a couple of dry crackers or a piece of toast. ? Avoid foods and smells that upset your stomach. Fatty and spicy foods may make nausea worse. ? Eat 5-6 small meals a day. ? Do not drink fluids while eating meals. Drink between meals. ? Eat or suck on things that have ginger in them. Ginger can help relieve nausea. ? Avoid food preparation. The smell of food can spoil your appetite or trigger nausea.  Follow instructions from your health care provider about eating or drinking restrictions.  For snacks, eat high-protein foods, such as cheese.  Keep all follow-up and pre-birth (prenatal) visits as told by your health care provider. This is important. Contact a health care provider if:  You have pain in your abdomen.  You have a severe headache.  You have vision problems.  You are losing weight. Get help right away if:  You cannot drink fluids without vomiting.  You vomit blood.  You have constant nausea and vomiting.  You are very weak.  You are very thirsty.  You feel dizzy.  You faint.  You have a fever or other symptoms that last for more than 2-3 days.  You have a fever and your symptoms suddenly get worse. Summary  Hyperemesis gravidarum is a severe form of nausea and vomiting that happens during pregnancy.  Making some changes to your eating habits may help relieve nausea and vomiting.  This condition may  be managed with medicine.  If medicines to do not help relieve nausea and vomiting, you may need to receive fluids through an IV tube at the hospital. This information is not intended to replace advice given to you by your health care provider. Make sure you discuss any questions you have with your health care provider. Document Released: 08/12/2005 Document Revised: 04/10/2016 Document Reviewed: 04/10/2016 Elsevier Interactive Patient Education  2017 [employer] First Trimester of Pregnancy The first trimester of pregnancy is from week 1 until the end of week 13 (months 1 through 3). During this time, your baby will begin to develop inside you. At 6-8 weeks, the eyes and face are formed, and the heartbeat can be seen on ultrasound. At the   end of 12 weeks, all the baby's organs are formed. Prenatal care is all the medical care you receive before the birth of your baby. Make sure you get good prenatal care and follow all of your doctor's instructions. Follow these instructions at home: Medicines  Take over-the-counter and prescription medicines only as told by your doctor. Some medicines are safe and some medicines are not safe during pregnancy.  Take a prenatal vitamin that contains at least 600 micrograms (mcg) of folic acid.  If you have trouble pooping (constipation), take medicine that will make your stool soft (stool softener) if your doctor approves. Eating and drinking  Eat regular, healthy meals.  Your doctor will tell you the amount of weight gain that is right for you.  Avoid raw meat and uncooked cheese.  If you feel sick to your stomach (nauseous) or throw up (vomit): ? Eat 4 or 5 small meals a day instead of 3 large meals. ? Try eating a few soda crackers. ? Drink liquids between meals instead of during meals.  To prevent constipation: ? Eat foods that are high in fiber, like fresh fruits and vegetables, whole grains, and beans. ? Drink enough fluids to keep your pee  (urine) clear or pale yellow. Activity  Exercise only as told by your doctor. Stop exercising if you have cramps or pain in your lower belly (abdomen) or low back.  Do not exercise if it is too hot, too humid, or if you are in a place of great height (high altitude).  Try to avoid standing for long periods of time. Move your legs often if you must stand in one place for a long time.  Avoid heavy lifting.  Wear low-heeled shoes. Sit and stand up straight.  You can have sex unless your doctor tells you not to. Relieving pain and discomfort  Wear a good support bra if your breasts are sore.  Take warm water baths (sitz baths) to soothe pain or discomfort caused by hemorrhoids. Use hemorrhoid cream if your doctor says it is okay.  Rest with your legs raised if you have leg cramps or low back pain.  If you have puffy, bulging veins (varicose veins) in your legs: ? Wear support hose or compression stockings as told by your doctor. ? Raise (elevate) your feet for 15 minutes, 3-4 times a day. ? Limit salt in your food. Prenatal care  Schedule your prenatal visits by the twelfth week of pregnancy.  Write down your questions. Take them to your prenatal visits.  Keep all your prenatal visits as told by your doctor. This is important. Safety  Wear your seat belt at all times when driving.  Make a list of emergency phone numbers. The list should include numbers for family, friends, the hospital, and police and fire departments. General instructions  Ask your doctor for a referral to a local prenatal class. Begin classes no later than at the start of month 6 of your pregnancy.  Ask for help if you need counseling or if you need help with nutrition. Your doctor can give you advice or tell you where to go for help.  Do not use hot tubs, steam rooms, or saunas.  Do not douche or use tampons or scented sanitary pads.  Do not cross your legs for long periods of time.  Avoid all herbs  and alcohol. Avoid drugs that are not approved by your doctor.  Do not use any tobacco products, including cigarettes, chewing tobacco, and electronic cigarettes.   If you need help quitting, ask your doctor. You may get counseling or other support to help you quit.  Avoid cat litter boxes and soil used by cats. These carry germs that can cause birth defects in the baby and can cause a loss of your baby (miscarriage) or stillbirth.  Visit your dentist. At home, brush your teeth with a soft toothbrush. Be gentle when you floss. Contact a doctor if:  You are dizzy.  You have mild cramps or pressure in your lower belly.  You have a nagging pain in your belly area.  You continue to feel sick to your stomach, you throw up, or you have watery poop (diarrhea).  You have a bad smelling fluid coming from your vagina.  You have pain when you pee (urinate).  You have increased puffiness (swelling) in your face, hands, legs, or ankles. Get help right away if:  You have a fever.  You are leaking fluid from your vagina.  You have spotting or bleeding from your vagina.  You have very bad belly cramping or pain.  You gain or lose weight rapidly.  You throw up blood. It may look like coffee grounds.  You are around people who have German measles, fifth disease, or chickenpox.  You have a very bad headache.  You have shortness of breath.  You have any kind of trauma, such as from a fall or a car accident. Summary  The first trimester of pregnancy is from week 1 until the end of week 13 (months 1 through 3).  To take care of yourself and your unborn baby, you will need to eat healthy meals, take medicines only if your doctor tells you to do so, and do activities that are safe for you and your baby.  Keep all follow-up visits as told by your doctor. This is important as your doctor will have to ensure that your baby is healthy and growing well. This information is not intended to replace  advice given to you by your health care provider. Make sure you discuss any questions you have with your health care provider. Document Released: 01/29/2008 Document Revised: 08/20/2016 Document Reviewed: 08/20/2016 Elsevier Interactive Patient Education  2017 [employer] Commonly Asked Questions During Pregnancy  Cats: A parasite can be excreted in cat feces.  To avoid exposure you need to have another person empty the little box.  If you must empty the litter box you will need to wear gloves.  Wash your hands after handling your cat.  This parasite can also be found in raw or undercooked meat so this should also be avoided.  Colds, Sore Throats, Flu: Please check your medication sheet to see what you can take for symptoms.  If your symptoms are unrelieved by these medications please call the office.  Dental Work: Most any dental work your dentist recommends is permitted.  X-rays should only be taken during the first trimester if absolutely necessary.  Your abdomen should be shielded with a lead apron during all x-rays.  Please notify your provider prior to receiving any x-rays.  Novocaine is fine; gas is not recommended.  If your dentist requires a note from us prior to dental work please call the office and we will provide one for you.  Exercise: Exercise is an important part of staying healthy during your pregnancy.  You may continue most exercises you were accustomed to prior to pregnancy.  Later in your pregnancy you will most likely notice you have difficulty with activities   requiring balance like riding a bicycle.  It is important that you listen to your body and avoid activities that put you at a higher risk of falling.  Adequate rest and staying well hydrated are a must!  If you have questions about the safety of specific activities ask your provider.    Exposure to Children with illness: Try to avoid obvious exposure; report any symptoms to us when noted,  If you have chicken pos, red  measles or mumps, you should be immune to these diseases.   Please do not take any vaccines while pregnant unless you have checked with your OB provider.  Fetal Movement: After 28 weeks we recommend you do "kick counts" twice daily.  Lie or sit down in a calm quiet environment and count your baby movements "kicks".  You should feel your baby at least 10 times per hour.  If you have not felt 10 kicks within the first hour get up, walk around and have something sweet to eat or drink then repeat for an additional hour.  If count remains less than 10 per hour notify your provider.  Fumigating: Follow your pest control agent's advice as to how long to stay out of your home.  Ventilate the area well before re-entering.  Hemorrhoids:   Most over-the-counter preparations can be used during pregnancy.  Check your medication to see what is safe to use.  It is important to use a stool softener or fiber in your diet and to drink lots of liquids.  If hemorrhoids seem to be getting worse please call the office.   Hot Tubs:  Hot tubs Jacuzzis and saunas are not recommended while pregnant.  These increase your internal body temperature and should be avoided.  Intercourse:  Sexual intercourse is safe during pregnancy as long as you are comfortable, unless otherwise advised by your provider.  Spotting may occur after intercourse; report any bright red bleeding that is heavier than spotting.  Labor:  If you know that you are in labor, please go to the hospital.  If you are unsure, please call the office and let us help you decide what to do.  Lifting, straining, etc:  If your job requires heavy lifting or straining please check with your provider for any limitations.  Generally, you should not lift items heavier than that you can lift simply with your hands and arms (no back muscles)  Painting:  Paint fumes do not harm your pregnancy, but may make you ill and should be avoided if possible.  Latex or water based paints  have less odor than oils.  Use adequate ventilation while painting.  Permanents & Hair Color:  Chemicals in hair dyes are not recommended as they cause increase hair dryness which can increase hair loss during pregnancy.  " Highlighting" and permanents are allowed.  Dye may be absorbed differently and permanents may not hold as well during pregnancy.  Sunbathing:  Use a sunscreen, as skin burns easily during pregnancy.  Drink plenty of fluids; avoid over heating.  Tanning Beds:  Because their possible side effects are still unknown, tanning beds are not recommended.  Ultrasound Scans:  Routine ultrasounds are performed at approximately 20 weeks.  You will be able to see your baby's general anatomy an if you would like to know the gender this can usually be determined as well.  If it is questionable when you conceived you may also receive an ultrasound early in your pregnancy for dating purposes.  Otherwise ultrasound exams   are not routinely performed unless there is a medical necessity.  Although you can request a scan we ask that you pay for it when conducted because insurance does not cover " patient request" scans.  Work: If your pregnancy proceeds without complications you may work until your due date, unless your physician or employer advises otherwise.  Round Ligament Pain/Pelvic Discomfort:  Sharp, shooting pains not associated with bleeding are fairly common, usually occurring in the second trimester of pregnancy.  They tend to be worse when standing up or when you remain standing for long periods of time.  These are the result of pressure of certain pelvic ligaments called "round ligaments".  Rest, Tylenol and heat seem to be the most effective relief.  As the womb and fetus grow, they rise out of the pelvis and the discomfort improves.  Please notify the office if your pain seems different than that described.  It may represent a more serious condition.   

## 2017-06-08 LAB — CULTURE, OB URINE

## 2017-06-08 LAB — URINE CULTURE, OB REFLEX

## 2017-06-08 LAB — GC/CHLAMYDIA PROBE AMP
CHLAMYDIA, DNA PROBE: NEGATIVE
NEISSERIA GONORRHOEAE BY PCR: NEGATIVE

## 2017-06-09 LAB — HEMOGLOBIN A1C
ESTIMATED AVERAGE GLUCOSE: 100 mg/dL
Hgb A1c MFr Bld: 5.1 % (ref 4.8–5.6)

## 2017-06-09 LAB — MONITOR DRUG PROFILE 14(MW)
AMPHETAMINE SCREEN URINE: NEGATIVE ng/mL
BARBITURATE SCREEN URINE: NEGATIVE ng/mL
BENZODIAZEPINE SCREEN, URINE: NEGATIVE ng/mL
Buprenorphine, Urine: NEGATIVE ng/mL
CANNABINOIDS UR QL SCN: NEGATIVE ng/mL
Cocaine (Metab) Scrn, Ur: NEGATIVE ng/mL
Creatinine(Crt), U: 54.5 mg/dL (ref 20.0–300.0)
Fentanyl, Urine: NEGATIVE pg/mL
MEPERIDINE SCREEN, URINE: NEGATIVE ng/mL
Methadone Screen, Urine: NEGATIVE ng/mL
OXYCODONE+OXYMORPHONE UR QL SCN: NEGATIVE ng/mL
Opiate Scrn, Ur: NEGATIVE ng/mL
PH UR, DRUG SCRN: 5.6 (ref 4.5–8.9)
PROPOXYPHENE SCREEN URINE: NEGATIVE ng/mL
Phencyclidine Qn, Ur: NEGATIVE ng/mL
SPECIFIC GRAVITY: 1.017
Tramadol Screen, Urine: NEGATIVE ng/mL

## 2017-06-09 LAB — ANTIBODY SCREEN: Antibody Screen: NEGATIVE

## 2017-06-09 LAB — URINALYSIS, ROUTINE W REFLEX MICROSCOPIC
Bilirubin, UA: NEGATIVE
Glucose, UA: NEGATIVE
Ketones, UA: NEGATIVE
Leukocytes, UA: NEGATIVE
NITRITE UA: NEGATIVE
Protein, UA: NEGATIVE
RBC, UA: NEGATIVE
Specific Gravity, UA: 1.013 (ref 1.005–1.030)
Urobilinogen, Ur: 0.2 mg/dL (ref 0.2–1.0)
pH, UA: 6 (ref 5.0–7.5)

## 2017-06-09 LAB — CBC WITH DIFFERENTIAL/PLATELET
BASOS ABS: 0 10*3/uL (ref 0.0–0.2)
Basos: 0 %
EOS (ABSOLUTE): 0.1 10*3/uL (ref 0.0–0.4)
Eos: 1 %
HEMOGLOBIN: 12.8 g/dL (ref 11.1–15.9)
Hematocrit: 39.3 % (ref 34.0–46.6)
IMMATURE GRANS (ABS): 0 10*3/uL (ref 0.0–0.1)
Immature Granulocytes: 0 %
LYMPHS: 26 %
Lymphocytes Absolute: 2.6 10*3/uL (ref 0.7–3.1)
MCH: 29.7 pg (ref 26.6–33.0)
MCHC: 32.6 g/dL (ref 31.5–35.7)
MCV: 91 fL (ref 79–97)
MONOCYTES: 6 %
Monocytes Absolute: 0.6 10*3/uL (ref 0.1–0.9)
Neutrophils Absolute: 6.7 10*3/uL (ref 1.4–7.0)
Neutrophils: 67 %
Platelets: 301 10*3/uL (ref 150–379)
RBC: 4.31 x10E6/uL (ref 3.77–5.28)
RDW: 13.3 % (ref 12.3–15.4)
WBC: 10.1 10*3/uL (ref 3.4–10.8)

## 2017-06-09 LAB — RH TYPE: Rh Factor: POSITIVE

## 2017-06-09 LAB — RUBELLA SCREEN: Rubella Antibodies, IGG: 1.3 index (ref >0.99)

## 2017-06-09 LAB — HIV ANTIBODY (ROUTINE TESTING W REFLEX): HIV SCREEN 4TH GENERATION: NONREACTIVE

## 2017-06-09 LAB — VARICELLA ZOSTER ANTIBODY, IGG: Varicella zoster IgG: 1295 index (ref >165)

## 2017-06-09 LAB — NICOTINE SCREEN, URINE: COTININE UR QL SCN: NEGATIVE ng/mL

## 2017-06-09 LAB — RPR: RPR Ser Ql: NONREACTIVE

## 2017-06-09 LAB — TSH: TSH: 0.405 u[IU]/mL — AB (ref 0.450–4.500)

## 2017-06-09 LAB — ABO

## 2017-06-09 LAB — HEPATITIS B SURFACE ANTIGEN: Hepatitis B Surface Ag: NEGATIVE

## 2017-06-18 ENCOUNTER — Encounter: Payer: 59 | Admitting: Obstetrics and Gynecology

## 2017-06-24 ENCOUNTER — Ambulatory Visit (INDEPENDENT_AMBULATORY_CARE_PROVIDER_SITE_OTHER): Payer: 59 | Admitting: Obstetrics and Gynecology

## 2017-06-24 VITALS — BP 133/82 | HR 87 | Wt 208.0 lb

## 2017-06-24 DIAGNOSIS — O09521 Supervision of elderly multigravida, first trimester: Secondary | ICD-10-CM

## 2017-06-24 DIAGNOSIS — Z98891 History of uterine scar from previous surgery: Secondary | ICD-10-CM

## 2017-06-24 DIAGNOSIS — Z3481 Encounter for supervision of other normal pregnancy, first trimester: Secondary | ICD-10-CM

## 2017-06-24 DIAGNOSIS — E669 Obesity, unspecified: Secondary | ICD-10-CM

## 2017-06-24 LAB — POCT URINALYSIS DIPSTICK
Bilirubin, UA: NEGATIVE
Blood, UA: NEGATIVE
GLUCOSE UA: NEGATIVE
Ketones, UA: NEGATIVE
Leukocytes, UA: NEGATIVE
NITRITE UA: NEGATIVE
Protein, UA: NEGATIVE
SPEC GRAV UA: 1.025 (ref 1.010–1.025)
UROBILINOGEN UA: 0.2 U/dL
pH, UA: 5 (ref 5.0–8.0)

## 2017-06-24 NOTE — Progress Notes (Signed)
ROB: Still considering genetic testing and AFP.  Likely drop both at 15-week visit.  We have discussed the risks and benefits of each test in great detail.  All of her questions were answered.  The patient declined examination and Pap because of history of spotting.  Will do new OB visit at next encounter. (Skip Pap).  Still has daily nausea without vomiting.  B6 helping some.  Remains undecided on TOLAC.  VBAC calculator = 66%.  Recommend early 1 hour GCT approximately 18 weeks because of history of diabetes, macrosomia and obesity.  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

## 2017-07-15 ENCOUNTER — Telehealth: Payer: Self-pay | Admitting: Obstetrics and Gynecology

## 2017-07-15 NOTE — Telephone Encounter (Signed)
Patient called and stated that she would like to speak with a nurse to discuss what genetic testing would be covered by insurance, and also the patient was hoping to obtain any CPT codes that her insurance can use/look up. No other information was disclosed. Please advise.

## 2017-07-15 NOTE — Telephone Encounter (Signed)
Message left on pts voicemail- she will need to call her ins co to see what if any genetic testing they cover and to call us back with the test covered and CPT codes can then be given.

## 2017-07-22 ENCOUNTER — Encounter: Payer: Self-pay | Admitting: Obstetrics and Gynecology

## 2017-07-22 ENCOUNTER — Ambulatory Visit (INDEPENDENT_AMBULATORY_CARE_PROVIDER_SITE_OTHER): Payer: 59 | Admitting: Obstetrics and Gynecology

## 2017-07-22 VITALS — BP 107/70 | HR 90 | Wt 211.3 lb

## 2017-07-22 DIAGNOSIS — O99210 Obesity complicating pregnancy, unspecified trimester: Secondary | ICD-10-CM

## 2017-07-22 DIAGNOSIS — R0989 Other specified symptoms and signs involving the circulatory and respiratory systems: Secondary | ICD-10-CM

## 2017-07-22 DIAGNOSIS — Z131 Encounter for screening for diabetes mellitus: Secondary | ICD-10-CM

## 2017-07-22 DIAGNOSIS — Z3482 Encounter for supervision of other normal pregnancy, second trimester: Secondary | ICD-10-CM

## 2017-07-22 DIAGNOSIS — O09522 Supervision of elderly multigravida, second trimester: Secondary | ICD-10-CM | POA: Insufficient documentation

## 2017-07-22 LAB — POCT URINALYSIS DIPSTICK
BILIRUBIN UA: NEGATIVE
Glucose, UA: NEGATIVE
KETONES UA: NEGATIVE
Leukocytes, UA: NEGATIVE
NITRITE UA: NEGATIVE
PH UA: 6.5 (ref 5.0–8.0)
Protein, UA: NEGATIVE
RBC UA: NEGATIVE
SPEC GRAV UA: 1.02 (ref 1.010–1.025)
Urobilinogen, UA: 0.2 E.U./dL

## 2017-07-22 NOTE — Progress Notes (Signed)
ROB: Notes persistent cough, not relieved by cough drops. Advised on Robitussin.  Discussed genetic testing again, patient notes she is going to contact her insurance company regarding coverage before ordering. For early glucola next visit.  Is planning on going out of town around time of next scheduled visit, so will bring patient back in 3 weeks instead of 4.  Declines flu vaccine.

## 2017-07-22 NOTE — Progress Notes (Signed)
ROB- Pt is c/o non productive cough, has tried cough drops, slight sore throat due to cough, denies fever,chills,body aches, nausea,vomiting,

## 2017-07-22 NOTE — Patient Instructions (Addendum)
Pregnancy and Zika Virus Disease Zika virus disease, or Zika, is an illness that can spread to people from mosquitoes that carry the virus. It may also spread from person to person through infected body fluids. Zika first occurred in [place], but recently it has spread to new areas. The virus occurs in tropical climates. The location of Zika continues to change. Most people who become infected with Zika virus do not develop serious illness. However, Zika may cause birth defects in an unborn baby whose mother is infected with the virus. It may also increase the risk of miscarriage. What are the symptoms of Zika virus disease? In many cases, people who have been infected with Zika virus do not develop any symptoms. If symptoms appear, they usually start about a week after the person is infected. Symptoms are usually mild. They may include:  Fever.  Rash.  Red eyes.  Joint pain.  How does Zika virus disease spread? The main way that Zika virus spreads is through the bite of a certain type of mosquito. Unlike most types of mosquitos, which bite only at night, the type of mosquito that carries Zika virus bites both at night and during the day. Zika virus can also spread through sexual contact, through a blood transfusion, and from a mother to her baby before or during birth. Once you have had Zika virus disease, it is unlikely that you will get it again. Can I pass Zika to my baby during pregnancy? Yes, Zika can pass from a mother to her baby before or during birth. What problems can Zika cause for my baby? A woman who is infected with Zika virus while pregnant is at risk of having her baby born with a condition in which the brain or head is smaller than expected (microcephaly). Babies who have microcephaly can have developmental delays, seizures, hearing problems, and vision problems. Having Zika virus disease during pregnancy can also increase the risk of miscarriage. How can Zika virus disease be  prevented? There is no vaccine to prevent Zika. The best way to prevent the disease is to avoid infected mosquitoes and avoid exposure to body fluids that can spread the virus. Avoid any possible exposure to Zika by taking the following precautions. For women and their sex partners:  Avoid traveling to high-risk areas. The locations where Zika is being reported change often. To identify high-risk areas, check the CDC travel website: www.cdc.gov/zika/geo/index.html  If you or your sex partner must travel to a high-risk area, talk with a health care provider before and after traveling.  Take all precautions to avoid mosquito bites if you live in, or travel to, any of the high-risk areas. Insect repellents are safe to use during pregnancy.  Ask your health care provider when it is safe to have sexual contact.  For women:  If you are pregnant or trying to become pregnant, avoid sexual contact with persons who may have been exposed to Zika virus, persons who have possible symptoms of Zika, or persons whose history you are unsure about. If you choose to have sexual contact with someone who may have been exposed to Zika virus, use condoms correctly during the entire duration of sexual activity, every time. Do not share sexual devices, as you may be exposed to body fluids.  Ask your health care provider about when it is safe to attempt pregnancy after a possible exposure to Zika virus.  What steps should I take to avoid mosquito bites? Take these steps to avoid mosquito bites   when you are in a high-risk area:  Wear loose clothing that covers your arms and legs.  Limit your outdoor activities.  Do not open windows unless they have window screens.  Sleep under mosquito nets.  Use insect repellent. The best insect repellents have:  DEET, picaridin, oil of lemon eucalyptus (OLE), or IR3535 in them.  Higher amounts of an active ingredient in them.  Remember that insect repellents are safe to  use during pregnancy.  Do not use OLE on children who are younger than 77 years of age. Do not use insect repellent on babies who are younger than 72 months of age.  Cover your child's stroller with mosquito netting. Make sure the netting fits snugly and that any loose netting does not cover your child's mouth or nose. Do not use a blanket as a mosquito-protection cover.  Do not apply insect repellent underneath clothing.  If you are using sunscreen, apply the sunscreen before applying the insect repellent.  Treat clothing with permethrin. Do not apply permethrin directly to your skin. Follow label directions for safe use.  Get rid of standing water, where mosquitoes may reproduce. Standing water is often found in items such as buckets, bowls, animal food dishes, and flowerpots.  When you return from traveling to any high-risk area, continue taking actions to protect yourself against mosquito bites for 3 weeks, even if you show no signs of illness. This will prevent spreading Zika virus to uninfected mosquitoes. What should I know about the sexual transmission of Zika? People can spread Zika to their sexual partners during vaginal, anal, or oral sex, or by sharing sexual devices. Many people with [place] do not develop symptoms, so a person could spread the disease without knowing that they are infected. The greatest risk is to women who are pregnant or who may become pregnant. Zika virus can live longer in semen than it can live in blood. Couples can prevent sexual transmission of the virus by:  Using condoms correctly during the entire duration of sexual activity, every time. This includes vaginal, anal, and oral sex.  Not sharing sexual devices. Sharing increases your risk of being exposed to body fluid from another person.  Avoiding all sexual activity until your health care provider says it is safe.  Should I be tested for Zika virus? A sample of your blood can be tested for Zika virus. A  pregnant woman should be tested if she may have been exposed to the virus or if she has symptoms of Zika. She may also have additional tests done during her pregnancy, such ultrasound testing. Talk with your health care provider about which tests are recommended. This information is not intended to replace advice given to you by your health care provider. Make sure you discuss any questions you have with your health care provider. Document Released: 05/03/2015 Document Revised: 01/18/2016 Document Reviewed: 04/26/2015 Elsevier Interactive Patient Education  2018 [employer].  Pregnancy and Zika Virus Disease Zika virus disease, or Zika, is an illness that can spread to people from mosquitoes that carry the virus. It may also spread from person to person through infected body fluids. Zika first occurred in [place], but recently it has spread to new areas. The virus occurs in tropical climates. The location of Zika continues to change. Most people who become infected with Zika virus do not develop serious illness. However, Zika may cause birth defects in an unborn baby whose mother is infected with the virus. It may also increase the risk  of miscarriage. What are the symptoms of Zika virus disease? In many cases, people who have been infected with Zika virus do not develop any symptoms. If symptoms appear, they usually start about a week after the person is infected. Symptoms are usually mild. They may include:  Fever.  Rash.  Red eyes.  Joint pain.  How does Zika virus disease spread? The main way that Zika virus spreads is through the bite of a certain type of mosquito. Unlike most types of mosquitos, which bite only at night, the type of mosquito that carries Zika virus bites both at night and during the day. Zika virus can also spread through sexual contact, through a blood transfusion, and from a mother to her baby before or during birth. Once you have had Zika virus disease, it is unlikely  that you will get it again. Can I pass Zika to my baby during pregnancy? Yes, Zika can pass from a mother to her baby before or during birth. What problems can Zika cause for my baby? A woman who is infected with Zika virus while pregnant is at risk of having her baby born with a condition in which the brain or head is smaller than expected (microcephaly). Babies who have microcephaly can have developmental delays, seizures, hearing problems, and vision problems. Having Zika virus disease during pregnancy can also increase the risk of miscarriage. How can Zika virus disease be prevented? There is no vaccine to prevent Zika. The best way to prevent the disease is to avoid infected mosquitoes and avoid exposure to body fluids that can spread the virus. Avoid any possible exposure to Zika by taking the following precautions. For women and their sex partners:  Avoid traveling to high-risk areas. The locations where [place] is being reported change often. To identify high-risk areas, check the CDC travel website: http://davidson-gomez.com/www.cdc.gov/zika/geo/index.html  If you or your sex partner must travel to a high-risk area, talk with a health care provider before and after traveling.  Take all precautions to avoid mosquito bites if you live in, or travel to, any of the high-risk areas. Insect repellents are safe to use during pregnancy.  Ask your health care provider when it is safe to have sexual contact.  For women:  If you are pregnant or trying to become pregnant, avoid sexual contact with persons who may have been exposed to [place] virus, persons who have possible symptoms of Zika, or persons whose history you are unsure about. If you choose to have sexual contact with someone who may have been exposed to [place] virus, use condoms correctly during the entire duration of sexual activity, every time. Do not share sexual devices, as you may be exposed to body fluids.  Ask your health care provider about when it is safe to  attempt pregnancy after a possible exposure to Zika virus.  What steps should I take to avoid mosquito bites? Take these steps to avoid mosquito bites when you are in a high-risk area:  Wear loose clothing that covers your arms and legs.  Limit your outdoor activities.  Do not open windows unless they have window screens.  Sleep under mosquito nets.  Use insect repellent. The best insect repellents have:  DEET, picaridin, oil of lemon eucalyptus (OLE), or IR3535 in them.  Higher amounts of an active ingredient in them.  Remember that insect repellents are safe to use during pregnancy.  Do not use OLE on children who are younger than 153 years of age. Do not use insect  repellent on babies who are younger than 512 months of age.  Cover your child's stroller with mosquito netting. Make sure the netting fits snugly and that any loose netting does not cover your child's mouth or nose. Do not use a blanket as a mosquito-protection cover.  Do not apply insect repellent underneath clothing.  If you are using sunscreen, apply the sunscreen before applying the insect repellent.  Treat clothing with permethrin. Do not apply permethrin directly to your skin. Follow label directions for safe use.  Get rid of standing water, where mosquitoes may reproduce. Standing water is often found in items such as buckets, bowls, animal food dishes, and flowerpots.  When you return from traveling to any high-risk area, continue taking actions to protect yourself against mosquito bites for 3 weeks, even if you show no signs of illness. This will prevent spreading Zika virus to uninfected mosquitoes. What should I know about the sexual transmission of Zika? People can spread Zika to their sexual partners during vaginal, anal, or oral sex, or by sharing sexual devices. Many people with [place] do not develop symptoms, so a person could spread the disease without knowing that they are infected. The greatest risk is to  women who are pregnant or who may become pregnant. Zika virus can live longer in semen than it can live in blood. Couples can prevent sexual transmission of the virus by:  Using condoms correctly during the entire duration of sexual activity, every time. This includes vaginal, anal, and oral sex.  Not sharing sexual devices. Sharing increases your risk of being exposed to body fluid from another person.  Avoiding all sexual activity until your health care provider says it is safe.  Should I be tested for Zika virus? A sample of your blood can be tested for Zika virus. A pregnant woman should be tested if she may have been exposed to the virus or if she has symptoms of Zika. She may also have additional tests done during her pregnancy, such ultrasound testing. Talk with your health care provider about which tests are recommended. This information is not intended to replace advice given to you by your health care provider. Make sure you discuss any questions you have with your health care provider. Document Released: 05/03/2015 Document Revised: 01/18/2016 Document Reviewed: 04/26/2015 Elsevier Interactive Patient Education  [employer]2018 Elsevier Inc.

## 2017-08-01 ENCOUNTER — Telehealth: Payer: Self-pay | Admitting: Obstetrics and Gynecology

## 2017-08-01 NOTE — Telephone Encounter (Signed)
Spoke with pt- she states she needs a prior [place] for genetic testing. Pt was given the [employer] reps name and number to contact. Pt is agreement.

## 2017-08-01 NOTE — Telephone Encounter (Signed)
The patient called and stated that she would like a call back from a nurse because her insurance needs a prior authorization code in order for them to cover genetic testing. No other information was disclosed. Please advise.

## 2017-08-07 ENCOUNTER — Other Ambulatory Visit: Payer: 59

## 2017-08-07 ENCOUNTER — Ambulatory Visit (INDEPENDENT_AMBULATORY_CARE_PROVIDER_SITE_OTHER): Payer: 59 | Admitting: Obstetrics and Gynecology

## 2017-08-07 ENCOUNTER — Encounter: Payer: Self-pay | Admitting: Obstetrics and Gynecology

## 2017-08-07 VITALS — BP 105/68 | HR 101 | Wt 211.2 lb

## 2017-08-07 DIAGNOSIS — Z131 Encounter for screening for diabetes mellitus: Secondary | ICD-10-CM

## 2017-08-07 DIAGNOSIS — Z3482 Encounter for supervision of other normal pregnancy, second trimester: Secondary | ICD-10-CM

## 2017-08-07 DIAGNOSIS — O09522 Supervision of elderly multigravida, second trimester: Secondary | ICD-10-CM

## 2017-08-07 DIAGNOSIS — O99210 Obesity complicating pregnancy, unspecified trimester: Secondary | ICD-10-CM

## 2017-08-07 DIAGNOSIS — Z98891 History of uterine scar from previous surgery: Secondary | ICD-10-CM

## 2017-08-07 LAB — POCT URINALYSIS DIPSTICK
BILIRUBIN UA: NEGATIVE
GLUCOSE UA: NEGATIVE
LEUKOCYTES UA: NEGATIVE
Nitrite, UA: NEGATIVE
Protein, UA: NEGATIVE
RBC UA: NEGATIVE
SPEC GRAV UA: 1.015 (ref 1.010–1.025)
Urobilinogen, UA: 0.2 E.U./dL
pH, UA: 5 (ref 5.0–8.0)

## 2017-08-07 NOTE — Progress Notes (Signed)
ROB:  1 hr GCT today.  PT desires MaternitT21 and afp.  Was having insurance concerns.   FAS scheduled for 12-27.  Orders placed for MaterniT21 and afp.  Cough improved.  Eating and drinking without issue.

## 2017-08-08 ENCOUNTER — Telehealth: Payer: Self-pay

## 2017-08-08 LAB — GLUCOSE, 1 HOUR GESTATIONAL: Gestational Diabetes Screen: 167 mg/dL — ABNORMAL HIGH (ref 65–139)

## 2017-08-08 NOTE — Telephone Encounter (Signed)
Left message on machine to call back, pt will need to schedule a 3 hour lab visit

## 2017-08-08 NOTE — Telephone Encounter (Signed)
Spoke to patient, She is schedule for a 3hr GTT on 08/22/17 @ 8:30 am . Thank you

## 2017-08-08 NOTE — Telephone Encounter (Signed)
-----   Message from Hildred LaserAnika Cherry, MD sent at 08/08/2017  8:23 AM EST ----- Please inform patient of abnormal 1 hour glucola screen.  She will need to return for a 3 hour GTT.

## 2017-08-11 ENCOUNTER — Other Ambulatory Visit: Payer: Self-pay | Admitting: Obstetrics and Gynecology

## 2017-08-11 DIAGNOSIS — Z369 Encounter for antenatal screening, unspecified: Secondary | ICD-10-CM

## 2017-08-21 ENCOUNTER — Ambulatory Visit (INDEPENDENT_AMBULATORY_CARE_PROVIDER_SITE_OTHER): Payer: 59

## 2017-08-21 ENCOUNTER — Other Ambulatory Visit: Payer: 59

## 2017-08-21 DIAGNOSIS — Z369 Encounter for antenatal screening, unspecified: Secondary | ICD-10-CM | POA: Diagnosis not present

## 2017-08-22 ENCOUNTER — Other Ambulatory Visit: Payer: 59

## 2017-08-22 DIAGNOSIS — Z3482 Encounter for supervision of other normal pregnancy, second trimester: Secondary | ICD-10-CM

## 2017-08-23 LAB — GESTATIONAL GLUCOSE TOLERANCE
GLUCOSE 3 HOUR GTT: 114 mg/dL (ref 65–139)
Glucose, Fasting: 77 mg/dL (ref 65–94)
Glucose, GTT - 1 Hour: 169 mg/dL (ref 65–179)
Glucose, GTT - 2 Hour: 141 mg/dL (ref 65–154)

## 2017-08-29 LAB — MATERNIT 21 PLUS CORE, BLOOD
Chromosome 13: NEGATIVE
Chromosome 18: NEGATIVE
Chromosome 21: NEGATIVE
Y CHROMOSOME: NOT DETECTED

## 2017-09-04 ENCOUNTER — Encounter: Payer: 59 | Admitting: Obstetrics and Gynecology

## 2017-09-09 ENCOUNTER — Ambulatory Visit (INDEPENDENT_AMBULATORY_CARE_PROVIDER_SITE_OTHER): Payer: 59 | Admitting: Obstetrics and Gynecology

## 2017-09-09 ENCOUNTER — Encounter: Payer: Self-pay | Admitting: Obstetrics and Gynecology

## 2017-09-09 VITALS — BP 116/70 | HR 72 | Wt 217.3 lb

## 2017-09-09 DIAGNOSIS — O09522 Supervision of elderly multigravida, second trimester: Secondary | ICD-10-CM

## 2017-09-09 DIAGNOSIS — Z3482 Encounter for supervision of other normal pregnancy, second trimester: Secondary | ICD-10-CM

## 2017-09-09 LAB — POCT URINALYSIS DIPSTICK
Bilirubin, UA: NEGATIVE
Glucose, UA: NEGATIVE
KETONES UA: NEGATIVE
Leukocytes, UA: NEGATIVE
NITRITE UA: NEGATIVE
PROTEIN UA: NEGATIVE
RBC UA: NEGATIVE
Spec Grav, UA: 1.01 (ref 1.010–1.025)
UROBILINOGEN UA: 0.2 U/dL
pH, UA: 7 (ref 5.0–8.0)

## 2017-09-09 NOTE — Progress Notes (Signed)
ROB- Pt states she is doing well, no complaints  

## 2017-09-09 NOTE — Progress Notes (Signed)
ROB:  Patient doing well, no complaints. Normal 3 hr GTT after abnormal 1 early glucola.  Will need repeat 3 hr testing at 28-30 weeks. Still unsure of desires for TOLAC vs repeat C/S, will decide closer to term (around 36 weeks). Normal anatomy scan. RTC in 4 weeks.

## 2017-10-02 ENCOUNTER — Other Ambulatory Visit: Payer: Self-pay

## 2017-10-02 ENCOUNTER — Ambulatory Visit (INDEPENDENT_AMBULATORY_CARE_PROVIDER_SITE_OTHER): Payer: 59 | Admitting: Obstetrics and Gynecology

## 2017-10-02 VITALS — BP 108/72 | HR 98 | Wt 220.8 lb

## 2017-10-02 DIAGNOSIS — O09522 Supervision of elderly multigravida, second trimester: Secondary | ICD-10-CM

## 2017-10-02 DIAGNOSIS — Z3492 Encounter for supervision of normal pregnancy, unspecified, second trimester: Secondary | ICD-10-CM

## 2017-10-02 DIAGNOSIS — Z131 Encounter for screening for diabetes mellitus: Secondary | ICD-10-CM

## 2017-10-02 LAB — POCT URINALYSIS DIPSTICK
BILIRUBIN UA: NEGATIVE
Blood, UA: NEGATIVE
KETONES UA: 5
Leukocytes, UA: NEGATIVE
NITRITE UA: NEGATIVE
Protein, UA: NEGATIVE
SPEC GRAV UA: 1.02 (ref 1.010–1.025)
Urobilinogen, UA: 0.2 E.U./dL
pH, UA: 6.5 (ref 5.0–8.0)

## 2017-10-02 NOTE — Addendum Note (Signed)
Addended by: Brooke DareSICK, Larue Drawdy L on: 10/02/2017 04:28 PM   Modules accepted: Orders

## 2017-10-02 NOTE — Progress Notes (Signed)
ROB: Patient scheduled for 3-hour GTT at 28 weeks.  [place] would like to do this test in [place].  Without complaint today.

## 2017-10-02 NOTE — Progress Notes (Signed)
ROB- pt is doing well 

## 2017-10-02 NOTE — Addendum Note (Signed)
Addended by: Brooke DareSICK, Malachy Coleman L on: 10/02/2017 04:31 PM   Modules accepted: Orders

## 2017-10-23 ENCOUNTER — Encounter: Payer: 59 | Admitting: Obstetrics and Gynecology

## 2017-10-28 ENCOUNTER — Other Ambulatory Visit: Payer: 59

## 2017-10-28 ENCOUNTER — Ambulatory Visit (INDEPENDENT_AMBULATORY_CARE_PROVIDER_SITE_OTHER): Payer: 59 | Admitting: Obstetrics and Gynecology

## 2017-10-28 VITALS — BP 105/66 | HR 90 | Wt 223.8 lb

## 2017-10-28 DIAGNOSIS — O09299 Supervision of pregnancy with other poor reproductive or obstetric history, unspecified trimester: Secondary | ICD-10-CM | POA: Insufficient documentation

## 2017-10-28 DIAGNOSIS — Z8632 Personal history of gestational diabetes: Secondary | ICD-10-CM

## 2017-10-28 DIAGNOSIS — Z3480 Encounter for supervision of other normal pregnancy, unspecified trimester: Secondary | ICD-10-CM

## 2017-10-28 DIAGNOSIS — O34219 Maternal care for unspecified type scar from previous cesarean delivery: Secondary | ICD-10-CM

## 2017-10-28 DIAGNOSIS — Z3483 Encounter for supervision of other normal pregnancy, third trimester: Secondary | ICD-10-CM

## 2017-10-28 LAB — POCT URINALYSIS DIPSTICK
Bilirubin, UA: NEGATIVE
Glucose, UA: NEGATIVE
KETONES UA: 15
NITRITE UA: NEGATIVE
PROTEIN UA: NEGATIVE
RBC UA: NEGATIVE
SPEC GRAV UA: 1.01 (ref 1.010–1.025)
UROBILINOGEN UA: 0.2 U/dL
pH, UA: 7 (ref 5.0–8.0)

## 2017-10-28 NOTE — Addendum Note (Signed)
Addended by: Silvano BilisHAMPTON, Zavia Pullen L on: 10/28/2017 11:09 AM   Modules accepted: Orders

## 2017-10-28 NOTE — Progress Notes (Signed)
ROB: Doing well, no complaints. For 3 hr GTT today.  Desires to postpone Tdap until next visit.  Desires to breast and bottle feed.   Desires permanent sterilization for contraception, BTL if she decides on repeat C-section, partner vasectomy if she has a succesful TOLAC.  Still has not decided yet on repeat C/S vs TOLAC, desires to wait until ~36 weeks to assess fetal size as she has a h/o fetal macrosomia. . Signed blood consent, discussed cord blood banking.

## 2017-10-28 NOTE — Progress Notes (Signed)
ROB- pt is doing well 

## 2017-10-29 LAB — CBC
Hematocrit: 37.6 % (ref 34.0–46.6)
Hemoglobin: 12.5 g/dL (ref 11.1–15.9)
MCH: 30.6 pg (ref 26.6–33.0)
MCHC: 33.2 g/dL (ref 31.5–35.7)
MCV: 92 fL (ref 79–97)
PLATELETS: 253 10*3/uL (ref 150–379)
RBC: 4.09 x10E6/uL (ref 3.77–5.28)
RDW: 13.5 % (ref 12.3–15.4)
WBC: 11.5 10*3/uL — AB (ref 3.4–10.8)

## 2017-10-29 LAB — GESTATIONAL GLUCOSE TOLERANCE
GLUCOSE 1 HOUR GTT: 158 mg/dL (ref 65–179)
Glucose, Fasting: 91 mg/dL (ref 65–94)
Glucose, GTT - 2 Hour: 149 mg/dL (ref 65–154)
Glucose, GTT - 3 Hour: 140 mg/dL — ABNORMAL HIGH (ref 65–139)

## 2017-10-29 LAB — RPR: RPR: NONREACTIVE

## 2017-11-11 ENCOUNTER — Encounter: Payer: Self-pay | Admitting: Obstetrics and Gynecology

## 2017-11-11 ENCOUNTER — Ambulatory Visit (INDEPENDENT_AMBULATORY_CARE_PROVIDER_SITE_OTHER): Payer: 59 | Admitting: Obstetrics and Gynecology

## 2017-11-11 VITALS — BP 100/69 | HR 96 | Wt 226.6 lb

## 2017-11-11 DIAGNOSIS — Z3483 Encounter for supervision of other normal pregnancy, third trimester: Secondary | ICD-10-CM | POA: Diagnosis not present

## 2017-11-11 LAB — POCT URINALYSIS DIPSTICK
BILIRUBIN UA: NEGATIVE
Blood, UA: NEGATIVE
Glucose, UA: NEGATIVE
KETONES UA: NEGATIVE
NITRITE UA: NEGATIVE
Protein, UA: NEGATIVE
SPEC GRAV UA: 1.015 (ref 1.010–1.025)
Urobilinogen, UA: 0.2 E.U./dL
pH, UA: 6.5 (ref 5.0–8.0)

## 2017-11-11 NOTE — Progress Notes (Signed)
ROB: Doing well, no complaints. Normal repeat 3 hr GTT and other 28 week labs. Requests letter for dental appointment.  RTC in 2 weeks.

## 2017-11-11 NOTE — Progress Notes (Signed)
ROB pt is doing well no concerns.  

## 2017-11-25 ENCOUNTER — Ambulatory Visit (INDEPENDENT_AMBULATORY_CARE_PROVIDER_SITE_OTHER): Payer: 59 | Admitting: Obstetrics and Gynecology

## 2017-11-25 ENCOUNTER — Encounter: Payer: Self-pay | Admitting: Obstetrics and Gynecology

## 2017-11-25 VITALS — BP 116/75 | HR 96 | Wt 228.8 lb

## 2017-11-25 DIAGNOSIS — Z3483 Encounter for supervision of other normal pregnancy, third trimester: Secondary | ICD-10-CM

## 2017-11-25 LAB — POCT URINALYSIS DIPSTICK
Bilirubin, UA: NEGATIVE
Blood, UA: NEGATIVE
Glucose, UA: NEGATIVE
Ketones, UA: NEGATIVE
Leukocytes, UA: NEGATIVE
NITRITE UA: NEGATIVE
PROTEIN UA: NEGATIVE
Spec Grav, UA: 1.015 (ref 1.010–1.025)
Urobilinogen, UA: 0.2 E.U./dL
pH, UA: 6 (ref 5.0–8.0)

## 2017-11-25 NOTE — Progress Notes (Signed)
ROB- pt is doing well 

## 2017-11-25 NOTE — Progress Notes (Signed)
ROB: Patient now leading towards cesarean delivery-repeat.  Has a history of very traumatic vaginal delivery and previous cesarean.  Considering ultrasound for size at 37 weeks to aid in her decision for TOLAC vaginal versus repeat cesarean.  (History of 2 previous macrosomic infants.)

## 2017-12-10 ENCOUNTER — Ambulatory Visit (INDEPENDENT_AMBULATORY_CARE_PROVIDER_SITE_OTHER): Payer: 59 | Admitting: Obstetrics and Gynecology

## 2017-12-10 ENCOUNTER — Encounter: Payer: Self-pay | Admitting: Obstetrics and Gynecology

## 2017-12-10 ENCOUNTER — Encounter: Payer: 59 | Admitting: Obstetrics and Gynecology

## 2017-12-10 VITALS — BP 112/73 | HR 98 | Wt 233.1 lb

## 2017-12-10 DIAGNOSIS — Z3483 Encounter for supervision of other normal pregnancy, third trimester: Secondary | ICD-10-CM

## 2017-12-10 DIAGNOSIS — Z3685 Encounter for antenatal screening for Streptococcus B: Secondary | ICD-10-CM

## 2017-12-10 DIAGNOSIS — O34219 Maternal care for unspecified type scar from previous cesarean delivery: Secondary | ICD-10-CM

## 2017-12-10 DIAGNOSIS — Z113 Encounter for screening for infections with a predominantly sexual mode of transmission: Secondary | ICD-10-CM

## 2017-12-10 LAB — POCT URINALYSIS DIPSTICK
Bilirubin, UA: NEGATIVE
Glucose, UA: NEGATIVE
Ketones, UA: NEGATIVE
NITRITE UA: NEGATIVE
PROTEIN UA: NEGATIVE
RBC UA: NEGATIVE
Urobilinogen, UA: 0.2 E.U./dL
pH, UA: 6.5 (ref 5.0–8.0)

## 2017-12-10 NOTE — Progress Notes (Signed)
ROB pt is doing well no concerns.  

## 2017-12-10 NOTE — Progress Notes (Signed)
ROB: Patient has decided that she wants a repeat cesarean delivery.  Scheduled for May 13.  Patient wants to have a repeat cesarean if she goes into labor before that.  GC/CT-GBS performed.

## 2017-12-12 LAB — STREP GP B NAA: Strep Gp B NAA: NEGATIVE

## 2017-12-13 LAB — GC/CHLAMYDIA PROBE AMP
CHLAMYDIA, DNA PROBE: NEGATIVE
Neisseria gonorrhoeae by PCR: NEGATIVE

## 2017-12-17 ENCOUNTER — Encounter: Payer: Self-pay | Admitting: Obstetrics and Gynecology

## 2017-12-17 ENCOUNTER — Ambulatory Visit (INDEPENDENT_AMBULATORY_CARE_PROVIDER_SITE_OTHER): Payer: 59 | Admitting: Obstetrics and Gynecology

## 2017-12-17 VITALS — BP 116/74 | HR 90 | Wt 235.5 lb

## 2017-12-17 DIAGNOSIS — O34219 Maternal care for unspecified type scar from previous cesarean delivery: Secondary | ICD-10-CM

## 2017-12-17 DIAGNOSIS — Z3483 Encounter for supervision of other normal pregnancy, third trimester: Secondary | ICD-10-CM

## 2017-12-17 LAB — POCT URINALYSIS DIPSTICK
BILIRUBIN UA: NEGATIVE
Blood, UA: NEGATIVE
Glucose, UA: NEGATIVE
Ketones, UA: NEGATIVE
Nitrite, UA: NEGATIVE
ODOR: NEGATIVE
PH UA: 6.5 (ref 5.0–8.0)
SPEC GRAV UA: 1.01 (ref 1.010–1.025)
UROBILINOGEN UA: 0.2 U/dL

## 2017-12-17 NOTE — Progress Notes (Signed)
ROB: Patient with the As&Ps of third trimester but otherwise well.  Says she is "counting the days" to her cesarean delivery.  Reports normal fetal movement.  We discussed lower extremity edema.

## 2017-12-17 NOTE — Progress Notes (Signed)
Rob- Swelling in legs and feet. Worse than last week. NO h/a, dizziness, blurred vision or seeing spots.

## 2017-12-24 ENCOUNTER — Ambulatory Visit (INDEPENDENT_AMBULATORY_CARE_PROVIDER_SITE_OTHER): Payer: 59 | Admitting: Obstetrics and Gynecology

## 2017-12-24 VITALS — BP 112/65 | HR 85 | Wt 237.4 lb

## 2017-12-24 DIAGNOSIS — Z308 Encounter for other contraceptive management: Secondary | ICD-10-CM

## 2017-12-24 DIAGNOSIS — Z3483 Encounter for supervision of other normal pregnancy, third trimester: Secondary | ICD-10-CM

## 2017-12-24 DIAGNOSIS — Z98891 History of uterine scar from previous surgery: Secondary | ICD-10-CM

## 2017-12-24 DIAGNOSIS — O1203 Gestational edema, third trimester: Secondary | ICD-10-CM

## 2017-12-24 DIAGNOSIS — O09523 Supervision of elderly multigravida, third trimester: Secondary | ICD-10-CM

## 2017-12-24 LAB — POCT URINALYSIS DIPSTICK
BILIRUBIN UA: NEGATIVE
Blood, UA: NEGATIVE
GLUCOSE UA: NEGATIVE
NITRITE UA: NEGATIVE
Protein, UA: NEGATIVE
Spec Grav, UA: 1.01 (ref 1.010–1.025)
Urobilinogen, UA: 0.2 E.U./dL
pH, UA: 7 (ref 5.0–8.0)

## 2017-12-24 NOTE — Progress Notes (Signed)
ROB- pt is doing well 

## 2017-12-24 NOTE — Progress Notes (Signed)
ROB: Patient doing well, no major complaints.  Still noting leg swelling. Discussed again elevation of legs, compression stockings if desired. Discussed repeat C-section again, has pre-op scheduled for next Friday. Patient now also notes she would like to have BTL at time of C-section. Counseled on all contraception options previously. Now desires permanent sterilization. Orders placed. 36 week cultures negative. RTC in 1 week.

## 2017-12-31 ENCOUNTER — Ambulatory Visit (INDEPENDENT_AMBULATORY_CARE_PROVIDER_SITE_OTHER): Payer: 59 | Admitting: Obstetrics and Gynecology

## 2017-12-31 ENCOUNTER — Encounter: Payer: Self-pay | Admitting: Obstetrics and Gynecology

## 2017-12-31 VITALS — BP 116/79 | HR 94 | Wt 237.3 lb

## 2017-12-31 DIAGNOSIS — Z98891 History of uterine scar from previous surgery: Secondary | ICD-10-CM

## 2017-12-31 DIAGNOSIS — Z3483 Encounter for supervision of other normal pregnancy, third trimester: Secondary | ICD-10-CM

## 2017-12-31 LAB — POCT URINALYSIS DIPSTICK
BILIRUBIN UA: NEGATIVE
Glucose, UA: NEGATIVE
Leukocytes, UA: NEGATIVE
NITRITE UA: NEGATIVE
Odor: NEGATIVE
PH UA: 7 (ref 5.0–8.0)
RBC UA: NEGATIVE
Spec Grav, UA: 1.015 (ref 1.010–1.025)
UROBILINOGEN UA: 0.2 U/dL

## 2017-12-31 NOTE — Progress Notes (Signed)
ROB: Patient generally feels well.  Denies regular contractions.  Has reaffirmed her desire for tubal ligation at the time of cesarean delivery.  All questions regarding surgery answered.

## 2017-12-31 NOTE — Progress Notes (Signed)
ROB- no complaints.  

## 2018-01-02 ENCOUNTER — Encounter
Admission: RE | Admit: 2018-01-02 | Discharge: 2018-01-02 | Disposition: A | Discharge status: Home / Self Care | Payer: 59 | Source: Ambulatory Visit | Attending: Obstetrics and Gynecology | Admitting: Obstetrics and Gynecology

## 2018-01-02 ENCOUNTER — Other Ambulatory Visit: Payer: Self-pay

## 2018-01-02 LAB — CBC
HCT: 34.9 % — ABNORMAL LOW (ref 35.0–47.0)
Hemoglobin: 12 g/dL (ref 12.0–16.0)
MCH: 31.1 pg (ref 26.0–34.0)
MCHC: 34.4 g/dL (ref 32.0–36.0)
MCV: 90.5 fL (ref 80.0–100.0)
PLATELETS: 188 10*3/uL (ref 150–440)
RBC: 3.86 MIL/uL (ref 3.80–5.20)
RDW: 13.8 % (ref 11.5–14.5)
WBC: 9 10*3/uL (ref 3.6–11.0)

## 2018-01-02 NOTE — Patient Instructions (Signed)
Your procedure is scheduled on: Jan 05, 2018 MONDAY Report to EMERGENCY DEPARTMENT AT 5:30 AM   REMEMBER: Instructions that are not followed completely may result in serious medical risk, up to and including death; or upon the discretion of your surgeon and anesthesiologist your surgery may need to be rescheduled.  Do not eat food after midnight the night before your procedure.  No gum chewing, lozengers or hard candies.  You may however, drink CLEAR liquids up to 2 hours before you are scheduled to arrive for your surgery. Do not drink anything within 2 hours of the start of your surgery.  Clear liquids include: - water   Do NOT drink anything that is not on this list.  Type 1 and Type 2 diabetics should only drink water.  No Alcohol for 24 hours before or after surgery.  No Smoking including e-cigarettes for 24 hours prior to surgery.  No chewable tobacco products for at least 6 hours prior to surgery.  No nicotine patches on the day of surgery.  On the morning of surgery brush your teeth with toothpaste and water, you may rinse your mouth with mouthwash if you wish. Do not swallow any toothpaste or mouthwash.  Notify your doctor if there is any change in your medical condition (cold, fever, infection).  Do not wear jewelry, make-up, hairpins, clips or nail polish.  Do not wear lotions, powders, or perfumes. You may wear deodorant.  Do not shave 48 hours prior to surgery. Men may shave face and neck.  Contacts and dentures may not be worn into surgery.  Do not bring valuables to the hospital, including drivers license, insurance or credit cards.  [facility] is not responsible for any belongings or valuables.   TAKE THESE MEDICATIONS THE MORNING OF SURGERY:  Use CHG  or wipes as directed on instruction sheet.  (May take Tylenol or Acetaminophen if needed.)  Stop ANY OVER THE COUNTER supplements until after surgery. (May continue Vitamin D, Vitamin B, and  multivitamin.)  Wear comfortable clothing (specific to your surgery type) to the hospital.  Plan for stool softeners for home use.  If you are being admitted to the hospital overnight, leave your suitcase in the car. After surgery it may be brought to your room.  If you are being discharged the day of surgery, you will not be allowed to drive home. You will need a responsible adult to drive you home and stay with you that night.   If you are taking public transportation, you will need to have a responsible adult with you. Please confirm with your physician that it is acceptable to use public transportation.   Please call [phone] if you have any questions about these instructions.

## 2018-01-03 LAB — TYPE AND SCREEN
ABO/RH(D): A POS
Antibody Screen: NEGATIVE
EXTEND SAMPLE REASON: UNDETERMINED

## 2018-01-03 LAB — RPR: RPR Ser Ql: NONREACTIVE

## 2018-01-04 MED ORDER — CEFAZOLIN SODIUM-DEXTROSE 2-4 GM/100ML-% IV SOLN
2.0000 g | INTRAVENOUS | Status: AC
  Administered 2018-01-05: 2 g via INTRAVENOUS
  Filled 2018-01-04 (×2): qty 100

## 2018-01-05 ENCOUNTER — Inpatient Hospital Stay: Payer: 59 | Admitting: Anesthesiology

## 2018-01-05 ENCOUNTER — Other Ambulatory Visit: Payer: Self-pay

## 2018-01-05 ENCOUNTER — Encounter
Admission: RE | Disposition: A | Discharge status: Home / Self Care | Payer: Self-pay | Source: Ambulatory Visit | Attending: Obstetrics and Gynecology

## 2018-01-05 ENCOUNTER — Inpatient Hospital Stay
Admission: RE | Admit: 2018-01-05 | Discharge: 2018-01-07 | DRG: 785 | Disposition: A | Discharge status: Home / Self Care | Payer: 59 | Source: Ambulatory Visit | Attending: Obstetrics and Gynecology | Admitting: Obstetrics and Gynecology

## 2018-01-05 DIAGNOSIS — O3663X0 Maternal care for excessive fetal growth, third trimester, not applicable or unspecified: Secondary | ICD-10-CM | POA: Diagnosis present

## 2018-01-05 DIAGNOSIS — Z3A39 39 weeks gestation of pregnancy: Secondary | ICD-10-CM

## 2018-01-05 DIAGNOSIS — O34211 Maternal care for low transverse scar from previous cesarean delivery: Principal | ICD-10-CM | POA: Diagnosis present

## 2018-01-05 DIAGNOSIS — O321XX0 Maternal care for breech presentation, not applicable or unspecified: Secondary | ICD-10-CM | POA: Diagnosis not present

## 2018-01-05 DIAGNOSIS — Z302 Encounter for sterilization: Secondary | ICD-10-CM | POA: Diagnosis not present

## 2018-01-05 DIAGNOSIS — Z3A37 37 weeks gestation of pregnancy: Secondary | ICD-10-CM

## 2018-01-05 LAB — ABO/RH: ABO/RH(D): A POS

## 2018-01-05 SURGERY — Surgical Case
Anesthesia: Spinal | Site: Abdomen | Wound class: Clean Contaminated

## 2018-01-05 MED ORDER — MORPHINE SULFATE (PF) 0.5 MG/ML IJ SOLN
INTRAMUSCULAR | Status: DC | PRN
  Administered 2018-01-05: .1 mg via INTRATHECAL

## 2018-01-05 MED ORDER — ONDANSETRON HCL 4 MG/2ML IJ SOLN: 4.0000 mg | Freq: Three times a day (TID) | INTRAMUSCULAR | Status: DC | PRN

## 2018-01-05 MED ORDER — LACTATED RINGERS IV SOLN: INTRAVENOUS | Status: DC

## 2018-01-05 MED ORDER — OXYTOCIN 40 UNITS IN LACTATED RINGERS INFUSION - SIMPLE MED
INTRAVENOUS | Status: AC
Start: 2018-01-05 — End: 2018-01-05
  Filled 2018-01-05: qty 1000

## 2018-01-05 MED ORDER — SODIUM CHLORIDE 0.9% FLUSH: 3.0000 mL | INTRAVENOUS | Status: DC | PRN

## 2018-01-05 MED ORDER — PROPOFOL 10 MG/ML IV BOLUS
INTRAVENOUS | Status: AC
  Filled 2018-01-05: qty 20

## 2018-01-05 MED ORDER — OXYTOCIN 40 UNITS IN LACTATED RINGERS INFUSION - SIMPLE MED
INTRAVENOUS | Status: DC | PRN
  Administered 2018-01-05: 10 mL via INTRAVENOUS

## 2018-01-05 MED ORDER — SOD CITRATE-CITRIC ACID 500-334 MG/5ML PO SOLN
30.0000 mL | ORAL | Status: AC
  Administered 2018-01-05: 30 mL via ORAL
  Filled 2018-01-05: qty 15

## 2018-01-05 MED ORDER — NALOXONE HCL 0.4 MG/ML IJ SOLN: 0.4000 mg | INTRAMUSCULAR | Status: DC | PRN

## 2018-01-05 MED ORDER — SODIUM CHLORIDE 0.9 % IV SOLN
INTRAVENOUS | Status: DC | PRN
  Administered 2018-01-05: 40 ug/min via INTRAVENOUS

## 2018-01-05 MED ORDER — ONDANSETRON HCL 4 MG/2ML IJ SOLN: 4.0000 mg | Freq: Once | INTRAMUSCULAR | Status: DC | PRN

## 2018-01-05 MED ORDER — OXYCODONE-ACETAMINOPHEN 5-325 MG PO TABS
1.0000 | ORAL_TABLET | ORAL | Status: DC | PRN
  Administered 2018-01-06 – 2018-01-07 (×4): 1 via ORAL
  Filled 2018-01-05 (×3): qty 1

## 2018-01-05 MED ORDER — IBUPROFEN 600 MG PO TABS
600.0000 mg | ORAL_TABLET | Freq: Four times a day (QID) | ORAL | Status: DC
  Administered 2018-01-05 – 2018-01-07 (×7): 600 mg via ORAL
  Filled 2018-01-05 (×9): qty 1

## 2018-01-05 MED ORDER — ACETAMINOPHEN 500 MG PO TABS
1000.0000 mg | ORAL_TABLET | Freq: Four times a day (QID) | ORAL | Status: AC
  Administered 2018-01-05 – 2018-01-06 (×4): 1000 mg via ORAL
  Filled 2018-01-05 (×4): qty 2

## 2018-01-05 MED ORDER — MENTHOL 3 MG MT LOZG
1.0000 | LOZENGE | OROMUCOSAL | Status: DC | PRN
  Filled 2018-01-05: qty 9

## 2018-01-05 MED ORDER — MEPERIDINE HCL 25 MG/ML IJ SOLN: 6.2500 mg | INTRAMUSCULAR | Status: DC | PRN

## 2018-01-05 MED ORDER — NALBUPHINE HCL 10 MG/ML IJ SOLN: 5.0000 mg | INTRAMUSCULAR | Status: DC | PRN

## 2018-01-05 MED ORDER — LIDOCAINE 5 % EX PTCH
MEDICATED_PATCH | CUTANEOUS | Status: DC | PRN
  Administered 2018-01-05: 1 via TRANSDERMAL

## 2018-01-05 MED ORDER — NALBUPHINE HCL 10 MG/ML IJ SOLN: 5.0000 mg | Freq: Once | INTRAMUSCULAR | Status: DC | PRN

## 2018-01-05 MED ORDER — BUPIVACAINE IN DEXTROSE 0.75-8.25 % IT SOLN
INTRATHECAL | Status: AC
  Filled 2018-01-05: qty 2

## 2018-01-05 MED ORDER — NALOXONE HCL 4 MG/10ML IJ SOLN
1.0000 ug/kg/h | INTRAVENOUS | Status: DC | PRN
  Filled 2018-01-05: qty 5

## 2018-01-05 MED ORDER — OXYTOCIN 40 UNITS IN LACTATED RINGERS INFUSION - SIMPLE MED
2.5000 [IU]/h | INTRAVENOUS | Status: AC
  Administered 2018-01-05: 2.5 [IU]/h via INTRAVENOUS

## 2018-01-05 MED ORDER — OXYCODONE-ACETAMINOPHEN 5-325 MG PO TABS
2.0000 | ORAL_TABLET | ORAL | Status: DC | PRN
  Filled 2018-01-05: qty 2

## 2018-01-05 MED ORDER — KETOROLAC TROMETHAMINE 30 MG/ML IJ SOLN: 30.0000 mg | Freq: Four times a day (QID) | INTRAMUSCULAR | Status: AC | PRN

## 2018-01-05 MED ORDER — LIDOCAINE 5 % EX PTCH
1.0000 | MEDICATED_PATCH | CUTANEOUS | Status: DC
  Filled 2018-01-05: qty 1

## 2018-01-05 MED ORDER — DIPHENHYDRAMINE HCL 25 MG PO CAPS: 25.0000 mg | ORAL_CAPSULE | Freq: Four times a day (QID) | ORAL | Status: DC | PRN

## 2018-01-05 MED ORDER — SENNOSIDES-DOCUSATE SODIUM 8.6-50 MG PO TABS
2.0000 | ORAL_TABLET | ORAL | Status: DC
  Administered 2018-01-06 – 2018-01-07 (×2): 2 via ORAL
  Filled 2018-01-05 (×2): qty 2

## 2018-01-05 MED ORDER — DIPHENHYDRAMINE HCL 25 MG PO CAPS
25.0000 mg | ORAL_CAPSULE | ORAL | Status: DC | PRN
Start: 2018-01-05 — End: 2018-01-07

## 2018-01-05 MED ORDER — DIPHENHYDRAMINE HCL 50 MG/ML IJ SOLN
12.5000 mg | INTRAMUSCULAR | Status: DC | PRN
  Administered 2018-01-05: 12.5 mg via INTRAVENOUS
  Filled 2018-01-05: qty 1

## 2018-01-05 MED ORDER — PRENATAL MULTIVITAMIN CH
1.0000 | ORAL_TABLET | Freq: Every day | ORAL | Status: DC
  Administered 2018-01-06 – 2018-01-07 (×2): 1 via ORAL
  Filled 2018-01-05 (×2): qty 1

## 2018-01-05 MED ORDER — ZOLPIDEM TARTRATE 5 MG PO TABS: 5.0000 mg | ORAL_TABLET | Freq: Every evening | ORAL | Status: DC | PRN

## 2018-01-05 MED ORDER — OXYTOCIN 40 UNITS IN LACTATED RINGERS INFUSION - SIMPLE MED
INTRAVENOUS | Status: AC
  Administered 2018-01-05: 2.5 [IU]/h via INTRAVENOUS
  Filled 2018-01-05: qty 1000

## 2018-01-05 MED ORDER — SIMETHICONE 80 MG PO CHEW
80.0000 mg | CHEWABLE_TABLET | Freq: Four times a day (QID) | ORAL | Status: DC
  Administered 2018-01-05 – 2018-01-07 (×8): 80 mg via ORAL
  Filled 2018-01-05 (×8): qty 1

## 2018-01-05 MED ORDER — FENTANYL CITRATE (PF) 100 MCG/2ML IJ SOLN: 25.0000 ug | INTRAMUSCULAR | Status: DC | PRN

## 2018-01-05 MED ORDER — BUPIVACAINE IN DEXTROSE 0.75-8.25 % IT SOLN
INTRATHECAL | Status: DC | PRN
  Administered 2018-01-05: 1.6 mL via INTRATHECAL

## 2018-01-05 MED ORDER — ACETAMINOPHEN 325 MG PO TABS: 650.0000 mg | ORAL_TABLET | ORAL | Status: DC | PRN

## 2018-01-05 MED ORDER — MORPHINE SULFATE (PF) 0.5 MG/ML IJ SOLN
INTRAMUSCULAR | Status: AC
  Filled 2018-01-05: qty 10

## 2018-01-05 MED ORDER — LACTATED RINGERS IV SOLN
INTRAVENOUS | Status: DC
  Administered 2018-01-05 (×4): via INTRAVENOUS

## 2018-01-05 MED ORDER — KETOROLAC TROMETHAMINE 30 MG/ML IJ SOLN
30.0000 mg | Freq: Four times a day (QID) | INTRAMUSCULAR | Status: AC | PRN
  Administered 2018-01-05: 30 mg via INTRAVENOUS
  Filled 2018-01-05: qty 1

## 2018-01-05 SURGICAL SUPPLY — 24 items
ADHESIVE MASTISOL STRL (MISCELLANEOUS) ×3 IMPLANT
BAG COUNTER SPONGE EZ (MISCELLANEOUS) ×2 IMPLANT
CANISTER SUCT 3000ML PPV (MISCELLANEOUS) ×3 IMPLANT
CELL SAVER LIPIGURD (MISCELLANEOUS) ×1 IMPLANT
CHLORAPREP W/TINT 26ML (MISCELLANEOUS) ×6 IMPLANT
CLIP FILSHIE TUBAL LIGA STRL (Clip) ×3 IMPLANT
COUNTER SPONGE BAG EZ (MISCELLANEOUS) ×1
DRSG TELFA 3X8 NADH (GAUZE/BANDAGES/DRESSINGS) ×3 IMPLANT
EXTRT SYSTEM ALEXIS 14CM (MISCELLANEOUS) ×3
GAUZE SPONGE 4X4 12PLY STRL (GAUZE/BANDAGES/DRESSINGS) ×3 IMPLANT
GLOVE BIOGEL PI ORTHO PRO 7.5 (GLOVE) ×2
GLOVE PI ORTHO PRO STRL 7.5 (GLOVE) ×1 IMPLANT
GOWN STRL REUS W/ TWL LRG LVL3 (GOWN DISPOSABLE) ×2 IMPLANT
GOWN STRL REUS W/TWL LRG LVL3 (GOWN DISPOSABLE) ×4
KIT TURNOVER KIT A (KITS) ×3 IMPLANT
NS IRRIG 1000ML POUR BTL (IV SOLUTION) ×3 IMPLANT
PACK C SECTION AR (MISCELLANEOUS) ×3 IMPLANT
PAD OB MATERNITY 4.3X12.25 (PERSONAL CARE ITEMS) ×3 IMPLANT
PAD PREP 24X41 OB/GYN DISP (PERSONAL CARE ITEMS) ×3 IMPLANT
SPONGE LAP 18X18 5 PK (GAUZE/BANDAGES/DRESSINGS) ×3 IMPLANT
SUT VIC AB 0 CTX 36 (SUTURE) ×4
SUT VIC AB 0 CTX36XBRD ANBCTRL (SUTURE) ×2 IMPLANT
SUT VIC AB 1 CT1 36 (SUTURE) ×6 IMPLANT
SUT VICRYL+ 3-0 36IN CT-1 (SUTURE) ×6 IMPLANT

## 2018-01-05 NOTE — Plan of Care (Signed)
Repeat c/s.  Uncomplicated surgery and initial PACU recovery in L&D.  Vital signs stable.  Lochia WNL.  Pain manageable.  Demonstrates appropriate bonding through skin to skin contact and initiation of breastfeeding.

## 2018-01-05 NOTE — Consult Note (Signed)
Neonatology Note:   Attendance at C-section:    I was asked by Dr. Evans to attend this repeat C/S at term. The mother is a G5P3A1 A pos, GBS neg with fetal macrosomia and abnormal early 1 hour glucola (normal 3 hour GTT). ROM at delivery, fluid clear. Infant vigorous with good spontaneous cry and tone. Delayed cord clamping was done. Needed only minimal bulb suctioning. Ap 9/9. Lungs clear to ausc in DR. Infant is able to remain with her mother for skin to skin time under nursing supervision. Transferred to the care of Pediatrician.   Mallory Clayton C. Stran Raper, MD   

## 2018-01-05 NOTE — Transfer of Care (Signed)
Immediate Anesthesia Transfer of Care Note  Patient: Mallory Clayton  Procedure(s) Performed: REPEAT CESAREAN SECTION WITH BILATERAL TUBALIGATION (N/A Abdomen)  Patient Location: PACU and Mother/Baby  Anesthesia Type:Spinal  Level of Consciousness: awake, alert  and oriented  Airway & Oxygen Therapy: Patient Spontanous Breathing  Post-op Assessment: Report given to RN and Post -op Vital signs reviewed and stable  Post vital signs: Reviewed and stable  Last Vitals:  Vitals Value Taken Time  BP 109/66   Temp 47F   Pulse 71   Resp 14   SpO2 97%     Last Pain:  Vitals:   01/05/18 0731  TempSrc:   PainSc: 0-No pain         Complications: No apparent anesthesia complications

## 2018-01-05 NOTE — Anesthesia Post-op Follow-up Note (Signed)
Anesthesia QCDR form completed.        

## 2018-01-05 NOTE — Lactation Note (Signed)
This note was copied from a baby's chart. Lactation Consultation Note  Patient Name: Mallory Clayton [id number] Date: 01/05/2018 Reason for consult: Initial assessment   Maternal Data Formula Feeding for Exclusion: No Does the patient have breastfeeding experience prior to this delivery?: Yes  Feeding Feeding Type: Breast Fed Length of feed: 5 min(few sucks each breast, gaggy, sleepy ) Had great feeding after delivery, gaggy, swallowing mucous, disinterested now, mom will attempt again in 30- 60 min   LATCH Score Latch: Repeated attempts needed to sustain latch, nipple held in mouth throughout feeding, stimulation needed to elicit sucking reflex.  Audible Swallowing: None  Type of Nipple: Everted at rest and after stimulation  Comfort (Breast/Nipple): Soft / non-tender  Hold (Positioning): Assistance needed to correctly position infant at breast and maintain latch.  LATCH Score: 6  Interventions Interventions: Breast feeding basics reviewed;Assisted with latch;Hand express;Breast compression;Adjust position;Support pillows  Lactation Tools Discussed/Used WIC Program: No   Consult Status Consult Status: Follow-up Date: 01/06/18 Follow-up type: In-patient    Dyann Kief 01/05/2018, 6:01 PM

## 2018-01-05 NOTE — H&P (Signed)
History and Physical   HPI  Mallory Clayton is a 37 y.o. [id number] at [redacted]w[redacted]d Estimated Date of Delivery: 01/09/18 who is being admitted for  Repeat CD and tubal.  OB History  OB History  Gravida Para Term Preterm AB Living  0 1 3  SAB TAB Ectopic Multiple Live Births  1 0 0 0 3    # Outcome Date GA Lbr Len/2nd Weight Sex Delivery Anes PTL Lv  5 Current           4 Term 2014   9 lb 1.6 oz (4.128 kg) M CS-LTranv   LIV  3 SAB 2013          2 Term 2010 [redacted]w[redacted]d  8 lb 1.6 oz (3.674 kg) M Vag-Spont   LIV  1 Term 2008   7 lb 11.2 oz (3.493 kg) M Vag-Spont   LIV    PROBLEM LIST  Pregnancy complications or risks: Patient Active Problem List   Diagnosis Date Noted  . History of macrosomia in infant in prior pregnancy, currently pregnant 10/28/2017  . Advanced maternal age in multigravida, second trimester 07/22/2017  . Nausea/vomiting in pregnancy 05/23/2017  . History of gestational diabetes 05/23/2017  . Positive urine pregnancy test 05/23/2017  . Fetal macrosomia in first trimester 05/23/2017  . History of postpartum hemorrhage 05/23/2017  . History of cesarean section 05/23/2017  . Irregular periods 05/23/2017  . Amenorrhea 05/23/2017  . Increased body mass index 05/09/2015  . SYNCOPE 06/18/2010    Prenatal labs and studies: ABO, Rh: --/--/A POS Performed at [facility], [address] [place], [place] [address]  [phone]) Antibody: NEG (05/10 0930) Rubella: 1.30 (10/12 1515) RPR: Non Reactive (05/10 0930)  HBsAg: Negative (10/12 1515)  HIV: Non Reactive (10/12 1515)  MWU:XLKGMWNU (04/17 1644)   Past Medical History:  Diagnosis Date  . Syncope    suspect vesovagal. normal ECG and QTc. echo 10/11: EF 55-60%, normal diastolic dysfunction, normal wall motion, no significant valvular abnm., no pulmonary htn, normal appearing RV. holter 10/11; occassional eptopic atrial rhythm with rate in 50s, no worrisome events, average HR 83.     Past  Surgical History:  Procedure Laterality Date  . CESAREAN SECTION  2014  . WISDOM TOOTH EXTRACTION       Medications    Current Discharge Medication List    CONTINUE these medications which have NOT CHANGED   Details  Prenatal Vit-Fe Fumarate-FA (PRENATAL MULTIVITAMIN) TABS tablet Take 1 tablet by mouth daily.          Allergies  Patient has no known allergies.  Review of Systems  Pertinent items are noted in HPI.  Physical Exam  BP 127/79 (BP Location: Left Arm)   Pulse 81   Temp 98.2 F (36.8 C) (Oral)   Resp 18   Ht  (1.651 m)   Wt 237 lb (107.5 kg)   LMP 04/04/2017 (Exact Date)   BMI 39.44 kg/m   Lungs:  CTA B Cardio: RRR without M/R/G Abd: Soft, gravid, NT Presentation: cephalic EXT: No C/C/ 1+ Edema DTRs: 2+ B CERVIX: not evaluated  :  See Prenatal records for more detailed PE.     Test Results  Results for orders placed or performed during the hospital encounter of 01/05/18 (from the past 24 hour(s))  ABO/Rh     Status: None   Collection Time: 01/05/18  6:48 AM  Result Value Ref Range   ABO/RH(D)  A POS Performed at [facility], [address] [place], [place] [address]      Assessment   [id number] at [redacted]w[redacted]d Estimated Date of Delivery: 01/09/18  Previous CD - desires repeat   Patient Active Problem List   Diagnosis Date Noted  . History of macrosomia in infant in prior pregnancy, currently pregnant 10/28/2017  . Advanced maternal age in multigravida, second trimester 07/22/2017  . Nausea/vomiting in pregnancy 05/23/2017  . History of gestational diabetes 05/23/2017  . Positive urine pregnancy test 05/23/2017  . Fetal macrosomia in first trimester 05/23/2017  . History of postpartum hemorrhage 05/23/2017  . History of cesarean section 05/23/2017  . Irregular periods 05/23/2017  . Amenorrhea 05/23/2017  . Increased body mass index 05/09/2015  . SYNCOPE 06/18/2010    Plan  1. Admit to L&D :    2. CD with  tubal   [name], M.D. 01/05/2018 11:06 AM

## 2018-01-05 NOTE — Op Note (Signed)
      OP NOTE  Date: 01/05/2018   1:00 PM Name Mallory Clayton MR# [id number]  Preoperative Diagnosis: 1. Intrauterine pregnancy at [redacted]w[redacted]d 2. Desires Permanent Sterilization 3. Desires repeat CD  Postoperative Diagnosis: 1. Intrauterine pregnancy at [redacted]w[redacted]d, delivered 2. Desires Permanent Sterilization 3. Viable infant (macrosomia) 4. Remainder same as pre-op  Procedure: 1. Repeat Low-Transverse Cesarean Section 2. Bilateral Tubal Occlusion  Surgeon: [name], MD  Assistant:  Valentino Saxon MD  Anesthesia: Spinal   EBL: 650 ml    Findings: 1) female infant, Apgar scores of 9    at 1 minute and 9    at 5 minutes and a birthweight of 156.97  ounces.    2) Normal uterus, tubes and ovaries.   Procedure:   The patient was prepped and draped in the supine position and placed under spinal anesthesia.  A transverse incision was made across the abdomen in a Pfannenstiel manner. If indicated the old scar was systematically removed with sharp dissection.  We carried the dissection down to the level of the fascia.  The fascia was incised in a curvilinear manner.  The fascia was then elevated from the rectus muscles with blunt and sharp dissection.  The rectus muscles were separated laterally exposing the peritoneum.  The peritoneum was carefully entered with care being taken to avoid bowel and bladder.  A self-retaining retractor was placed.  The visceral peritoneum was incised in a curvilinear fashion across the lower uterine segment creating a bladder flap. A transverse incision was made across the lower uterine segment and extended laterally and superiorly using the bandage scissors.  Artificial rupture membranes was performed and Clear fluid was noted.  The infant was delivered from the cephalic position.  A nuchal cord was not present. The cord was doubly clamped and cut. Cord blood was obtained if appropriate.  The infant was handed to the pediatric personnel  who then placed the infant  under heat lamps where it was cleaned dried and re-suctioned. The placenta was delivered. The hysterotomy incision was then identified on ring forceps.  The uterine cavity was cleaned with a moist lap sponge.  The hysterotomy incision was closed with a running interlocking suture of Vicryl.  Hemostasis was excellent.  Pitocin was run in the IV and the uterus was found to be firm. The fallopian tubes were identified  and followed out to their fine fimbriated ends and then back to the mid-portion of the tube which was elevated on a babcock clamp.  Both tubes were completely occluded using Filshie clips in a perpendicular manner.  Hemostasis was noted. The posterior cul-de-sac and gutters were cleaned and inspected.  Hemostasis was noted.  The fascia was then closed with a running suture of #1 Vicryl.  Hemostasis of the subcutaneous tissues was obtained using the Bovie.  The subcutaneous tissues were closed with a running suture of 000 Vicryl.  A subcuticular suture was placed.  Steri-Strips were applied in the usual manner.  A pressure dressing was placed.  The patient went to the recovery room in stable condition.   [name], M.D. 01/05/2018 1:00 PM

## 2018-01-05 NOTE — Anesthesia Preprocedure Evaluation (Signed)
Anesthesia Evaluation  Patient identified by MRN, date of birth, ID band Patient awake    Reviewed: Allergy & Precautions, NPO status , Patient's Chart, lab work & pertinent test results  History of Anesthesia Complications Negative for: history of anesthetic complications  Airway Mallampati: II       Dental   Pulmonary neg sleep apnea, neg COPD,           Cardiovascular (-) hypertension(-) Past MI and (-) CHF (-) dysrhythmias (-) Valvular Problems/Murmurs     Neuro/Psych neg Seizures    GI/Hepatic Neg liver ROS, neg GERD  ,  Endo/Other  neg diabetes  Renal/GU negative Renal ROS     Musculoskeletal   Abdominal   Peds  Hematology   Anesthesia Other Findings   Reproductive/Obstetrics (+) Pregnancy                             Anesthesia Physical Anesthesia Plan  ASA: II  Anesthesia Plan: Spinal   Post-op Pain Management:    Induction:   PONV Risk Score and Plan:   Airway Management Planned:   Additional Equipment:   Intra-op Plan:   Post-operative Plan:   Informed Consent: I have reviewed the patients History and Physical, chart, labs and discussed the procedure including the risks, benefits and alternatives for the proposed anesthesia with the patient or authorized representative who has indicated his/her understanding and acceptance.     Plan Discussed with:   Anesthesia Plan Comments:         Anesthesia Quick Evaluation

## 2018-01-05 NOTE — ED Triage Notes (Signed)
Pt arrived for scheduled c-section; due 01/09/18; pt at [facility]; denies complications; denies abd pain; G5P3; pt ambulatory to LDR with Salley Scarlet, RN

## 2018-01-05 NOTE — Anesthesia Procedure Notes (Signed)
Spinal  Patient location during procedure: OR Start time: 01/05/2018 11:50 AM End time: 01/05/2018 11:55 AM Staffing Anesthesiologist: Gunnar Fusi, MD Resident/CRNA: Dionne Bucy, CRNA Performed: resident/CRNA  Preanesthetic Checklist Completed: patient identified, site marked, surgical consent, pre-op evaluation, timeout performed, IV checked, risks and benefits discussed and monitors and equipment checked Spinal Block Patient position: sitting Prep: Betadine Patient monitoring: heart rate, continuous pulse ox, blood pressure and cardiac monitor Approach: midline Location: L4-5 Injection technique: single-shot Needle Needle type: Introducer and Pencan  Needle gauge: 24 G Needle length: 10 cm Assessment Sensory level: T6 Additional Notes Negative paresthesia. Negative blood return. Positive free-flowing CSF. Expiration date of kit checked and confirmed. Patient tolerated procedure well, without complications.

## 2018-01-05 NOTE — Interval H&P Note (Signed)
History and Physical Interval Note:  01/05/2018 11:08 AM  Mallory Clayton  has presented today for surgery, with the diagnosis of PREVIOUS C SECTION  The various methods of treatment have been discussed with the patient and family. After consideration of risks, benefits and other options for treatment, the patient has consented to  Procedure(s): REPEAT CESAREAN SECTION (N/A) as a surgical intervention .  The patient's history has been reviewed, patient examined, no change in status, stable for surgery.  I have reviewed the patient's chart and labs.  Questions were answered to the patient's satisfaction.     Brennan Bailey

## 2018-01-06 ENCOUNTER — Encounter: Payer: Self-pay | Admitting: Obstetrics and Gynecology

## 2018-01-06 NOTE — Lactation Note (Signed)
This note was copied from a baby's chart. Lactation Consultation Note  Patient Name: Mallory Clayton Today's Date: 01/06/2018     During [facility] rounds today, Mom said breastfeeding was going "well" and she did not need help or have questions. She has LC contact info and [facility] info.    Maternal Data    Feeding Feeding Type: Breast Fed Length of feed: 45 min  LATCH Score                   Interventions    Lactation Tools Discussed/Used     Consult Status      Sunday Corn 01/06/2018, 5:59 PM

## 2018-01-06 NOTE — Progress Notes (Signed)
Patient up to chair. RN encouraged patient to ambulate in hall.

## 2018-01-06 NOTE — Anesthesia Post-op Follow-up Note (Signed)
  Anesthesia Pain Follow-up Note  Patient: Mallory Clayton  Day #: 1  Date of Follow-up: 01/06/2018 Time: 8:09 AM  Last Vitals:  Vitals:   01/05/18 2047 01/06/18 0000  BP: 121/82 122/76  Pulse: 89 84  Resp: 18 18  Temp: 37.3 C 36.7 C  SpO2: 97% 98%    Level of Consciousness: alert  Pain: none   Side Effects:None  Catheter Site Exam:clean, dry, no drainage     Plan: D/C from anesthesia care at surgeon's request  Starling Manns

## 2018-01-06 NOTE — Progress Notes (Signed)
Vitals stable and WDL this morning. Patient tolerating PO foods/fluids. No flatus yet, per patient. Active bowel sounds in all four quadrants. Foley catheter removed at 08:15. Patient up to void at 10:15 ( ). Fundus firm, bleeding small. Patient to ambulate in hall later today. Breastfeeding infant. Patient feels it's going well. Pain ranging from 0/10 to 3/10; controlled with oral pain meds.

## 2018-01-06 NOTE — Anesthesia Postprocedure Evaluation (Signed)
Anesthesia Post Note  Patient: DONYAE KOHN  Procedure(s) Performed: REPEAT CESAREAN SECTION WITH BILATERAL TUBALIGATION (N/A Abdomen)  Patient location during evaluation: Mother Baby Anesthesia Type: Spinal Level of consciousness: oriented and awake and alert Pain management: pain level controlled Vital Signs Assessment: post-procedure vital signs reviewed and stable Respiratory status: spontaneous breathing, respiratory function stable and patient connected to nasal cannula oxygen Cardiovascular status: blood pressure returned to baseline and stable Postop Assessment: no headache, no backache and no apparent nausea or vomiting Anesthetic complications: no     Last Vitals:  Vitals:   01/05/18 2047 01/06/18 0000  BP: 121/82 122/76  Pulse: 89 84  Resp: 18 18  Temp: 37.3 C 36.7 C  SpO2: 97% 98%    Last Pain:  Vitals:   01/06/18 0605  TempSrc:   PainSc: 3                  Starling Manns

## 2018-01-06 NOTE — Plan of Care (Signed)
Vs stable; tolerating regular diet; moving well in bed; foley to be removed during this shift; taking PO motrin and PO tylenol for pain control

## 2018-01-06 NOTE — Progress Notes (Signed)
Patient ID: Mallory Clayton, female   DOB: 1980-12-06, 37 y.o.   MRN: [id number]    Progress Note - Cesarean Delivery  Mallory Clayton is a 37 y.o. [id number] now PP day 1 s/p C-Section, Low Transverse .   Subjective:  Patient reports no problems with eating, bowel movements, voiding, or their wound  Objective:  Vital signs in last 24 hours: Temp:  [98 F (36.7 C)-99.2 F (37.3 C)] 98 F (36.7 C) (05/14 0000) Pulse Rate:  [61-89] 84 (05/14 0000) Resp:  [16-20] 18 (05/14 0000) BP: (109-125)/(61-82) 122/76 (05/14 0000) SpO2:  [97 %-99 %] 98 % (05/14 0000)  Physical Exam:  General: alert, cooperative, appears stated age and no distress Lochia: appropriate Uterine Fundus: firm Incision: Dressing intact DVT Evaluation: No evidence of DVT seen on physical exam.    Data Review No results for input(s): HGB, HCT in the last 72 hours.  Assessment:  Active Problems:   Delivery by cesarean section using transverse incision of lower segment of uterus   Status post Cesarean section. Doing well postoperatively.     Plan:       Continue current care.    [name], M.D. 01/06/2018 8:18 AM

## 2018-01-07 MED ORDER — HYDROCODONE-ACETAMINOPHEN 5-325 MG PO TABS: 1.0000 | ORAL_TABLET | Freq: Four times a day (QID) | ORAL | 0 refills | Status: DC | PRN

## 2018-01-07 MED ORDER — OXYCODONE-ACETAMINOPHEN 5-325 MG PO TABS: 1.0000 | ORAL_TABLET | ORAL | 0 refills | Status: DC | PRN

## 2018-01-07 NOTE — Discharge Summary (Signed)
    Physician Obstetric Discharge Summary  Patient ID: Mallory Clayton MRN: [id number] DOB/AGE: [birth year] 37 y.o.   Date of Admission: 01/05/2018  Date of Discharge: 01/07/2018  Admitting Diagnosis: Scheduled cesarean section at [redacted]w[redacted]d  Mode of Delivery: repeat cesarean section and tubal ligation was performed       low uterine, transverse     Discharge Diagnosis: No other diagnosis    Complications: none                        Discharge Day SOAP Note:  Subjective:  The patient has no complaints.  She is ambulating well. She is taking PO well. Pain is well controlled with current medications. Patient is urinating without difficulty.   She is passing flatus.    Objective  Vital signs in last 24 hours: BP 121/73 (BP Location: Right Arm)   Pulse 79   Temp 98.2 F (36.8 C) (Oral)   Resp 18   Ht  (1.651 m)   Wt 237 lb (107.5 kg)   LMP 04/04/2017 (Exact Date)   SpO2 96%   Breastfeeding? Unknown   BMI 39.44 kg/m   Physical Exam: Gen: NAD Abdomen:  clean, dry, no drainage Fundus Fundal Tone: Firm  Lochia Amount: Small     Data Review Labs: CBC Latest Ref Rng & Units 01/02/2018 10/28/2017 06/06/2017  WBC 3.6 - 11.0 K/uL 9.0 11.5(H) 10.1  Hemoglobin 12.0 - 16.0 g/dL 40.9 81.1 91.4  Hematocrit 35.0 - 47.0 % 34.9(L) 37.6 39.3  Platelets 150 - 440 K/uL 188 253 301   A POS Performed at [facility], [address] [place], [place] [address]   Assessment:  Active Problems:   Delivery by cesarean section using transverse incision of lower segment of uterus   Doing well.  Normal progress as expected.    Plan:  Discharge to home  Modified rest as directed - may slowly resume normal activities with restrictions  as discussed.  Medications as written.  See below for additional.       Discharge Instructions: Per After Visit Summary. Activity: Advance as tolerated. Pelvic rest for 6 weeks.  Also refer to After Visit Summary.  Wound care  discussed. Diet: Regular Medications: Allergies as of 01/07/2018   No Known Allergies     Medication List    TAKE these medications   HYDROcodone-acetaminophen 5-325 MG tablet Commonly known as:  NORCO/VICODIN Take 1-2 tablets by mouth every 6 (six) hours as needed for moderate pain. Notes to patient:  Last dose 2pm   oxyCODONE-acetaminophen 5-325 MG tablet Commonly known as:  PERCOCET/ROXICET Take 1 tablet by mouth every 4 (four) hours as needed (pain scale 4-7).   prenatal multivitamin Tabs tablet Take 1 tablet by mouth daily.      Outpatient follow up:  Follow-up Information    Linzie Collin, MD Follow up in 1 week(s).   Specialty:  Obstetrics and Gynecology Contact information: [address] [place] [address] [phone]          Postpartum contraception: Will discuss at first post-partum visit.  Discharged Condition: good  Discharged to: home  Newborn Data: Disposition:home with mother  Apgars: APGAR (1 MIN): 9   APGAR (5 MINS): 9   APGAR (10 MINS):    Baby Feeding: Breast  [name], M.D. 01/07/2018 5:36 PM

## 2018-01-07 NOTE — Progress Notes (Signed)
Discharge instr reviewed with pt.  Rx given for home use.  Discharge to home to car via [facility]

## 2018-01-12 ENCOUNTER — Ambulatory Visit (INDEPENDENT_AMBULATORY_CARE_PROVIDER_SITE_OTHER): Payer: 59 | Admitting: Obstetrics and Gynecology

## 2018-01-12 ENCOUNTER — Encounter: Payer: Self-pay | Admitting: Obstetrics and Gynecology

## 2018-01-12 VITALS — BP 111/76 | HR 78 | Ht 65.0 in | Wt 214.7 lb

## 2018-01-12 DIAGNOSIS — Z9889 Other specified postprocedural states: Secondary | ICD-10-CM

## 2018-01-12 NOTE — Progress Notes (Signed)
HPI:      [name] is a 37 y.o. [id number] who LMP was Patient's last menstrual period was 04/04/2017 (exact date).  Subjective:   She presents today 1 week from cesarean delivery.  She reports she is doing well.  She is ambulating voiding and eating without difficulty.  She reports normal bowel movements.  She continues to breast-feed.  She is not using anything except ibuprofen for pain management.    Hx: The following portions of the patient's history were reviewed and updated as appropriate:             She  has a past medical history of Syncope. She does not have any pertinent problems on file. She  has a past surgical history that includes Cesarean section (2014); Wisdom tooth extraction; and Cesarean section (N/A, 01/05/2018). Her family history includes Breast cancer in her maternal aunt; Colon cancer in her maternal aunt and maternal uncle; Diabetes in her mother; Heart disease in her unknown relative. She  reports that she has never smoked. She has never used smokeless tobacco. She reports that she does not drink alcohol or use drugs. She has a current medication list which includes the following prescription(s): ibuprofen and prenatal multivitamin. She has No Known Allergies.       Review of Systems:  Review of Systems  Constitutional: Denied constitutional symptoms, night sweats, recent illness, fatigue, fever, insomnia and weight loss.  Eyes: Denied eye symptoms, eye pain, photophobia, vision change and visual disturbance.  Ears/Nose/Throat/Neck: Denied ear, nose, throat or neck symptoms, hearing loss, nasal discharge, sinus congestion and sore throat.  Cardiovascular: Denied cardiovascular symptoms, arrhythmia, chest pain/pressure, edema, exercise intolerance, orthopnea and palpitations.  Respiratory: Denied pulmonary symptoms, asthma, pleuritic pain, productive sputum, cough, dyspnea and wheezing.  Gastrointestinal: Denied, gastro-esophageal reflux, melena, nausea and  vomiting.  Genitourinary: Denied genitourinary symptoms including symptomatic vaginal discharge, pelvic relaxation issues, and urinary complaints.  Musculoskeletal: Denied musculoskeletal symptoms, stiffness, swelling, muscle weakness and myalgia.  Dermatologic: Denied dermatology symptoms, rash and scar.  Neurologic: Denied neurology symptoms, dizziness, headache, neck pain and syncope.  Psychiatric: Denied psychiatric symptoms, anxiety and depression.  Endocrine: Denied endocrine symptoms including hot flashes and night sweats.   Meds:   Current Outpatient Medications on File Prior to Visit  Medication Sig Dispense Refill  . ibuprofen (ADVIL,MOTRIN) 800 MG tablet Take 800 mg by mouth every 8 (eight) hours as needed.    . Prenatal Vit-Fe Fumarate-FA (PRENATAL MULTIVITAMIN) TABS tablet Take 1 tablet by mouth daily.      No current facility-administered medications on file prior to visit.     Objective:     Vitals:   01/12/18 0907  BP: 111/76  Pulse: 78               Abdomen: Soft.  Non-tender.  No masses.  No HSM.  Incision/s: Intact.  Healing well.  No erythema.  No drainage.      Assessment:    [id number] Patient Active Problem List   Diagnosis Date Noted  . Delivery by cesarean section using transverse incision of lower segment of uterus 01/05/2018  . History of macrosomia in infant in prior pregnancy, currently pregnant 10/28/2017  . Advanced maternal age in multigravida, second trimester 07/22/2017  . Nausea/vomiting in pregnancy 05/23/2017  . History of gestational diabetes 05/23/2017  . Positive urine pregnancy test 05/23/2017  . Fetal macrosomia in first trimester 05/23/2017  . History of postpartum hemorrhage 05/23/2017  . History of cesarean section 05/23/2017  .  Irregular periods 05/23/2017  . Amenorrhea 05/23/2017  . Increased body mass index 05/09/2015  . SYNCOPE 06/18/2010     1. Post-operative state     Patient with excellent recovery doing  fine.   Plan:            1.  Discussed removal of Steri-Strips and wound care.  2.  Follow-up in 5 weeks for postpartum check Orders No orders of the defined types were placed in this encounter.   No orders of the defined types were placed in this encounter.     F/U  Return in about 5 weeks (around 02/16/2018).  [name], M.D. 01/12/2018 9:21 AM

## 2018-02-04 ENCOUNTER — Telehealth: Payer: Self-pay | Admitting: Obstetrics and Gynecology

## 2018-02-04 NOTE — Telephone Encounter (Signed)
Pt is s/p c/s x 1 month. She noticed a small amount of drainage from the incision. Opening approx. 1/4 an inch. Yellowish drainage. No odor. No fevers.  NO pain. Slightly pink. Advised pt to clean area with hydrogen peroxide. Use neosporin. Keep area covered. If no better in 72 hours to contact office for appointment.

## 2018-02-04 NOTE — Telephone Encounter (Signed)
Patient called stating her C-section site has drainage.  No fever, pain, blood or foul odor but does look like infection.  Dr. Logan BoresEvans delivered her on 01/05/18.  Contact number is [phone], please advise, thanks.

## 2018-02-17 ENCOUNTER — Encounter: Payer: Self-pay | Admitting: Obstetrics and Gynecology

## 2018-02-17 ENCOUNTER — Ambulatory Visit (INDEPENDENT_AMBULATORY_CARE_PROVIDER_SITE_OTHER): Payer: 59 | Admitting: Obstetrics and Gynecology

## 2018-02-17 VITALS — BP 99/67 | HR 78 | Ht 65.0 in | Wt 200.7 lb

## 2018-02-17 DIAGNOSIS — O9122 Nonpurulent mastitis associated with the puerperium: Secondary | ICD-10-CM

## 2018-02-17 DIAGNOSIS — Z9889 Other specified postprocedural states: Secondary | ICD-10-CM

## 2018-02-17 MED ORDER — DICLOXACILLIN SODIUM 500 MG PO CAPS: 500.0000 mg | ORAL_CAPSULE | Freq: Four times a day (QID) | ORAL | 0 refills | Status: DC

## 2018-02-17 NOTE — Progress Notes (Signed)
Pt stated that she is doing well and her  c-section incision is healing well. No complaints.

## 2018-02-17 NOTE — Progress Notes (Signed)
HPI:      [name] is a 37 y.o. [id number] who LMP was Patient's last menstrual period was 04/04/2017 (exact date).  Subjective:   She presents today 6 weeks postpartum from cesarean delivery and tubal occlusion.  She reports she is generally doing well.  She continues to breast-feed full-time.  She states that she has developed pain in her right breast with redness.  She says that she was febrile yesterday. She reports that she has not resumed intercourse. She continues to breast-feed from the breast described above.    Hx: The following portions of the patient's history were reviewed and updated as appropriate:             She  has a past medical history of Syncope. She does not have any pertinent problems on file. She  has a past surgical history that includes Cesarean section (2014); Wisdom tooth extraction; and Cesarean section (N/A, 01/05/2018). Her family history includes Breast cancer in her maternal aunt; Colon cancer in her maternal aunt and maternal uncle; Diabetes in her mother; Heart disease in her unknown relative. She  reports that she has never smoked. She has never used smokeless tobacco. She reports that she does not drink alcohol or use drugs. She has a current medication list which includes the following prescription(s): ibuprofen, prenatal multivitamin, and dicloxacillin. She has No Known Allergies.       Review of Systems:  Review of Systems  Constitutional: Denied constitutional symptoms, night sweats, recent illness, fatigue, fever, insomnia and weight loss.  Eyes: Denied eye symptoms, eye pain, photophobia, vision change and visual disturbance.  Ears/Nose/Throat/Neck: Denied ear, nose, throat or neck symptoms, hearing loss, nasal discharge, sinus congestion and sore throat.  Cardiovascular: Denied cardiovascular symptoms, arrhythmia, chest pain/pressure, edema, exercise intolerance, orthopnea and palpitations.  Respiratory: Denied pulmonary symptoms, asthma,  pleuritic pain, productive sputum, cough, dyspnea and wheezing.  Gastrointestinal: Denied, gastro-esophageal reflux, melena, nausea and vomiting.  Genitourinary: See HPI for additional information.  Musculoskeletal: Denied musculoskeletal symptoms, stiffness, swelling, muscle weakness and myalgia.  Dermatologic: Denied dermatology symptoms, rash and scar.  Neurologic: Denied neurology symptoms, dizziness, headache, neck pain and syncope.  Psychiatric: Denied psychiatric symptoms, anxiety and depression.  Endocrine: Denied endocrine symptoms including hot flashes and night sweats.   Meds:   Current Outpatient Medications on File Prior to Visit  Medication Sig Dispense Refill  . ibuprofen (ADVIL,MOTRIN) 800 MG tablet Take 800 mg by mouth every 8 (eight) hours as needed.    . Prenatal Vit-Fe Fumarate-FA (PRENATAL MULTIVITAMIN) TABS tablet Take 1 tablet by mouth daily.      No current facility-administered medications on file prior to visit.     Objective:     Vitals:   02/17/18 0938  BP: 99/67  Pulse: 78   Abdomen: Soft.  Non-tender.  No masses.  No HSM.  Incision/s: Intact.  Healing well.  No erythema.  No drainage.   Breast examination reveals erythema and tenderness with some breast fullness in the right lower quadrant of the right breast. Left breast reveals no abnormalities.           Pelvic examination   Pelvic:   Vulva: Normal appearance.  No lesions.  No abnormal scarring.    Vagina: No lesions or abnormalities noted.  Support: Normal pelvic support.  Urethra No masses tenderness or scarring.  Meatus Normal size without lesions or prolapse.  Cervix: Normal ectropion.  No lesions.  Anus: Normal exam.  No lesions.  Perineum: Normal  exam.  No lesions.  Healed well.          Bimanual   Uterus: Normal size.  Non-tender.  Mobile.  AV.  Adnexae: No masses.  Non-tender to palpation.  Cul-de-sac: Negative for abnormality.     Assessment:    [id number] Patient Active  Problem List   Diagnosis Date Noted  . Delivery by cesarean section using transverse incision of lower segment of uterus 01/05/2018  . History of macrosomia in infant in prior pregnancy, currently pregnant 10/28/2017  . Advanced maternal age in multigravida, second trimester 07/22/2017  . Nausea/vomiting in pregnancy 05/23/2017  . History of gestational diabetes 05/23/2017  . Positive urine pregnancy test 05/23/2017  . Fetal macrosomia in first trimester 05/23/2017  . History of postpartum hemorrhage 05/23/2017  . History of cesarean section 05/23/2017  . Irregular periods 05/23/2017  . Amenorrhea 05/23/2017  . Increased body mass index 05/09/2015  . SYNCOPE 06/18/2010     1. Mastitis, postpartum   2. Postpartum care and examination immediately after delivery   3. Post-operative state     Suspect early mastitis based on erythema and pain and fever. Patient otherwise doing very well from cesarean delivery.   Plan:            1.  Patient may resume normal activities with exception of heavy lifting.  2.  Antibiotics for suspected mastitis   Discussed continuation of breast-feeding, warm compresses to the breast, massage etc. Orders No orders of the defined types were placed in this encounter.    Meds ordered this encounter  Medications  . dicloxacillin (DYNAPEN) 500 MG capsule    Sig: Take 1 capsule (500 mg total) by mouth 4 (four) times daily.    Dispense:  28 capsule    Refill:  0      F/U  Return in about 3 months (around 05/20/2018) for Annual Physical.  Mallory Clayton, M.D. 02/17/2018 10:06 AM

## 2018-04-16 ENCOUNTER — Other Ambulatory Visit: Payer: Self-pay

## 2018-04-16 ENCOUNTER — Ambulatory Visit
Admission: EM | Admit: 2018-04-16 | Discharge: 2018-04-16 | Disposition: A | Discharge status: Home / Self Care | Payer: 59 | Attending: Family Medicine | Admitting: Family Medicine

## 2018-04-16 DIAGNOSIS — L509 Urticaria, unspecified: Secondary | ICD-10-CM | POA: Diagnosis not present

## 2018-04-16 MED ORDER — PREDNISONE 20 MG PO TABS: 40.0000 mg | ORAL_TABLET | Freq: Every day | ORAL | 0 refills | Status: DC

## 2018-04-16 NOTE — ED Triage Notes (Signed)
Patient complains of hives that started this morning when she woke up to feed her 114 week old baby. Patient states that she has not started any new medications, soaps, detergents. Patient states that she took an epsom salt bath this morning.

## 2018-04-16 NOTE — Discharge Instructions (Addendum)
Take over the counter claritin and pepcid as discussed. Take medication as prescribed. Rest. Drink plenty of fluids.   Follow up with your primary care physician this week as needed. Return to Urgent care for new or worsening concerns.

## 2018-04-16 NOTE — ED Provider Notes (Signed)
[facility] ____________________________________________  Time seen: Approximately 8:54 AM  I have reviewed the triage vital signs and the nursing notes.   HISTORY  Chief Complaint Urticaria  HPI Mallory Clayton is a 37 y.o. female presenting for evaluation of itchy hives present since approximately 5 AM this morning.  States that she had gotten up and when she went back to bed she noticed that she was itching.  Denies any changes in foods, medicines, lotions, detergents or contacts.  Denies others in household with similar.  Denies any insect bites.  States that other than itchy she feels completely fine.  Denies any oral or throat swelling, shortness of breath or difficulty breathing.  Denies history of similar in the past.  Did take an Epson salt soak bath prior to arrival.  No other alleviating measures attempted.  She is currently breast-feeding her 4514-week-old daughter.  Reports that her daughter is healthy without medical problems.  Denies current pregnancy.  Reports otherwise feels well. Denies recent sickness. Denies recent antibiotic use.   Janeann ForehandHawkins, James H Jr., MD: PCP   Past Medical History:  Diagnosis Date  . Syncope    suspect vesovagal. normal ECG and QTc. echo 10/11: EF 55-60%, normal diastolic dysfunction, normal wall motion, no significant valvular abnm., no pulmonary htn, normal appearing RV. holter 10/11; occassional eptopic atrial rhythm with rate in 50s, no worrisome events, average HR 83.    Patient Active Problem List   Diagnosis Date Noted  . Delivery by cesarean section using transverse incision of lower segment of uterus 01/05/2018  . History of macrosomia in infant in prior pregnancy, currently pregnant 10/28/2017  . Advanced maternal age in multigravida, second trimester 07/22/2017  . Nausea/vomiting in pregnancy 05/23/2017  . History of gestational diabetes 05/23/2017  . Positive urine pregnancy test 05/23/2017  . Fetal macrosomia in first  trimester 05/23/2017  . History of postpartum hemorrhage 05/23/2017  . History of cesarean section 05/23/2017  . Irregular periods 05/23/2017  . Amenorrhea 05/23/2017  . Increased body mass index 05/09/2015  . SYNCOPE 06/18/2010    Past Surgical History:  Procedure Laterality Date  . CESAREAN SECTION  2014  . CESAREAN SECTION N/A 01/05/2018   Procedure: REPEAT CESAREAN SECTION WITH BILATERAL TUBALIGATION;  Surgeon: Linzie CollinEvans, David James, MD;  Location: [facility] ORS;  Service: Obstetrics;  Laterality: N/A;  . WISDOM TOOTH EXTRACTION       No current facility-administered medications for this encounter.   Current Outpatient Medications:  .  Prenatal Vit-Fe Fumarate-FA (PRENATAL MULTIVITAMIN) TABS tablet, Take 1 tablet by mouth daily. , Disp: , Rfl:  .  predniSONE (DELTASONE) 20 MG tablet, Take 2 tablets (40 mg total) by mouth daily., Disp: 10 tablet, Rfl: 0  Allergies Patient has no known allergies.  Family History  Problem Relation Age of Onset  . Diabetes Mother        side of her family  . Heart disease Unknown        GF - heart trouble   . Colon cancer Maternal Aunt   . Breast cancer Maternal Aunt   . Colon cancer Maternal Uncle   . Coronary artery disease Neg Hx        congenital heart disease, sudden death   . Ovarian cancer Neg Hx     Social History Social History   Tobacco Use  . Smoking status: Never Smoker  . Smokeless tobacco: Never Used  . Tobacco comment: non smoker  Substance Use Topics  . Alcohol use: No  .  Drug use: No    Review of Systems Constitutional: No fever/chills Cardiovascular: Denies chest pain. Respiratory: Denies shortness of breath. Gastrointestinal: No abdominal pain.   Musculoskeletal: Negative for back pain. Skin: As above. ____________________________________________   PHYSICAL EXAM:  VITAL SIGNS: ED Triage Vitals  Enc Vitals Group     BP 04/16/18 0826 120/81     Pulse Rate 04/16/18 0826 70     Resp 04/16/18 0826 18      Temp 04/16/18 0826 98.2 F (36.8 C)     Temp Source 04/16/18 0826 Oral     SpO2 04/16/18 0826 99 %     Weight 04/16/18 0824 200 lb (90.7 kg)     Height 04/16/18 0824 5\' 5"  (1.651 m)     Head Circumference --      Peak Flow --      Pain Score 04/16/18 0824 0     Pain Loc --      Pain Edu? --      Excl. in GC? --     Constitutional: Alert and oriented. Well appearing and in no acute distress. Eyes: Conjunctivae are normal.  ENT      Head: Normocephalic and atraumatic.      Nose: No congestion/rhinnorhea.      Mouth/Throat: Mucous membranes are moist.Oropharynx non-erythematous.  No lip, tongue or oral pharyngeal edema noted. Neck: No stridor. Supple without meningismus.  Cardiovascular: Normal rate, regular rhythm. Grossly normal heart sounds.  Good peripheral circulation. Respiratory: Normal respiratory effort without tachypnea nor retractions. Breath sounds are clear and equal bilaterally. No wheezes, rales, rhonchi. Musculoskeletal:  Steady gait. No extremity edema noted. Neurologic:  Normal speech and language. Speech is normal. No gait instability.  Skin:  Skin is warm, dry.  Urticarial rash present to bilateral upper extremities, scattered to torso and lower extremities and few areas to neck, not on face, no edema, no drainage, no surrounding erythema, patient actively scratching. Psychiatric: Mood and affect are normal. Speech and behavior are normal. Patient exhibits appropriate insight and judgment   ___________________________________________   LABS (all labs ordered are listed, but only abnormal results are displayed)  Labs Reviewed - No data to display  PROCEDURES Procedures   INITIAL IMPRESSION / ASSESSMENT AND PLAN / ED COURSE  Pertinent labs & imaging results that were available during my care of the patient were reviewed by me and considered in my medical decision making (see chart for details).  Well-appearing patient.  No acute distress.  Urticarial rash.   Unsure of trigger.  Reports feeling well otherwise.  Patient is a breast-feeding mother of a 22-week-old.  Mother states that she does supplement child with formula, but would like to continue breast-feeding.  Will treat with Claritin 10 mg daily and Pepcid 20 mg daily for 5 to 6 days as initial line.  Discussed use of prednisone, and child receiving, mother would like to try Pepcid and Claritin first, Rx given for prednisone if symptoms not improve within 24 hours or if they worsen to then begin prednisone.  States that she will get Pepcid and Claritin over-the-counter.  Discussed breast-feeding considerations with this and mother further rise understanding and agreed with this plan.  Discussed sooner return parameters as needed.Discussed indication, risks and benefits of medications with patient.   Discussed follow up with Primary care physician this week. Discussed follow up and return parameters including no resolution or any worsening concerns. Patient verbalized understanding and agreed to plan.   ____________________________________________   FINAL CLINICAL IMPRESSION(S) /  ED DIAGNOSES  Final diagnoses:  Urticaria     ED Discharge Orders         Ordered    predniSONE (DELTASONE) 20 MG tablet  Daily     04/16/18 0838           Note: This dictation was prepared with Dragon dictation along with smaller phrase technology. Any transcriptional errors that result from this process are unintentional.         Renford Dills, NP 04/16/18 [phone]

## 2018-05-18 NOTE — Progress Notes (Deleted)
Pt presents today for annual exam

## 2018-05-20 ENCOUNTER — Encounter: Payer: 59 | Admitting: Obstetrics and Gynecology

## 2018-07-22 ENCOUNTER — Other Ambulatory Visit (HOSPITAL_COMMUNITY)
Admission: RE | Admit: 2018-07-22 | Discharge: 2018-07-22 | Disposition: A | Discharge status: Home / Self Care | Payer: 59 | Source: Ambulatory Visit | Attending: Obstetrics and Gynecology | Admitting: Obstetrics and Gynecology

## 2018-07-22 ENCOUNTER — Ambulatory Visit (INDEPENDENT_AMBULATORY_CARE_PROVIDER_SITE_OTHER): Payer: 59 | Admitting: Obstetrics and Gynecology

## 2018-07-22 ENCOUNTER — Encounter: Payer: Self-pay | Admitting: Obstetrics and Gynecology

## 2018-07-22 VITALS — BP 115/74 | HR 76 | Ht 65.0 in | Wt 215.0 lb

## 2018-07-22 DIAGNOSIS — Z0000 Encounter for general adult medical examination without abnormal findings: Secondary | ICD-10-CM

## 2018-07-22 NOTE — Progress Notes (Signed)
HPI:      Ms. Ellin SabaJessica L Ciullo is a 37 y.o. [id number] who LMP was No LMP recorded.  Subjective:   She presents today for her annual examination.  She continues to breast-feed full-time and is not having menstrual periods.  She is doing well and has no concerns.    Hx: The following portions of the patient's history were reviewed and updated as appropriate:             She  has a past medical history of Syncope. She does not have any pertinent problems on file. She  has a past surgical history that includes Cesarean section (2014); Wisdom tooth extraction; and Cesarean section (N/A, 01/05/2018). Her family history includes Breast cancer in her maternal aunt; Colon cancer in her maternal aunt and maternal uncle; Diabetes in her mother; Heart disease in her unknown relative. She  reports that she has never smoked. She has never used smokeless tobacco. She reports that she does not drink alcohol or use drugs. She has a current medication list which includes the following prescription(s): prenatal multivitamin. She has No Known Allergies.       Review of Systems:  Review of Systems  Constitutional: Denied constitutional symptoms, night sweats, recent illness, fatigue, fever, insomnia and weight loss.  Eyes: Denied eye symptoms, eye pain, photophobia, vision change and visual disturbance.  Ears/Nose/Throat/Neck: Denied ear, nose, throat or neck symptoms, hearing loss, nasal discharge, sinus congestion and sore throat.  Cardiovascular: Denied cardiovascular symptoms, arrhythmia, chest pain/pressure, edema, exercise intolerance, orthopnea and palpitations.  Respiratory: Denied pulmonary symptoms, asthma, pleuritic pain, productive sputum, cough, dyspnea and wheezing.  Gastrointestinal: Denied, gastro-esophageal reflux, melena, nausea and vomiting.  Genitourinary: Denied genitourinary symptoms including symptomatic vaginal discharge, pelvic relaxation issues, and urinary complaints.  Musculoskeletal:  Denied musculoskeletal symptoms, stiffness, swelling, muscle weakness and myalgia.  Dermatologic: Denied dermatology symptoms, rash and scar.  Neurologic: Denied neurology symptoms, dizziness, headache, neck pain and syncope.  Psychiatric: Denied psychiatric symptoms, anxiety and depression.  Endocrine: Denied endocrine symptoms including hot flashes and night sweats.   Meds:   Current Outpatient Medications on File Prior to Visit  Medication Sig Dispense Refill  . Prenatal Vit-Fe Fumarate-FA (PRENATAL MULTIVITAMIN) TABS tablet Take 1 tablet by mouth daily.      No current facility-administered medications on file prior to visit.     Objective:     Vitals:   07/22/18 1033  BP: 115/74  Pulse: 76              Physical examination General NAD, Conversant  HEENT Atraumatic; Op clear with mmm.  Normo-cephalic. Pupils reactive. Anicteric sclerae  Thyroid/Neck Smooth without nodularity or enlargement. Normal ROM.  Neck Supple.  Skin No rashes, lesions or ulceration. Normal palpated skin turgor. No nodularity.  Breasts: No masses or discharge.  Symmetric.  No axillary adenopathy.  Lungs: Clear to auscultation.No rales or wheezes. Normal Respiratory effort, no retractions.  Heart: NSR.  No murmurs or rubs appreciated. No periferal edema  Abdomen: Soft.  Non-tender.  No masses.  No HSM. No hernia  Extremities: Moves all appropriately.  Normal ROM for age. No lymphadenopathy.  Neuro: Oriented to PPT.  Normal mood. Normal affect.     Pelvic:   Vulva: Normal appearance.  No lesions.  Vagina: No lesions or abnormalities noted.  Support: Normal pelvic support.  Urethra No masses tenderness or scarring.  Meatus Normal size without lesions or prolapse.  Cervix: Normal appearance.  No lesions.  Anus: Normal exam.  No lesions.  Perineum: Normal exam.  No lesions.        Bimanual   Uterus: Normal size.  Non-tender.  Mobile.  AV.  Adnexae: No masses.  Non-tender to palpation.  Cul-de-sac:  Negative for abnormality.    Assessment:    [id number] Patient Active Problem List   Diagnosis Date Noted  . Delivery by cesarean section using transverse incision of lower segment of uterus 01/05/2018  . History of macrosomia in infant in prior pregnancy, currently pregnant 10/28/2017  . Advanced maternal age in multigravida, second trimester 07/22/2017  . Nausea/vomiting in pregnancy 05/23/2017  . History of gestational diabetes 05/23/2017  . Positive urine pregnancy test 05/23/2017  . Fetal macrosomia in first trimester 05/23/2017  . History of postpartum hemorrhage 05/23/2017  . History of cesarean section 05/23/2017  . Irregular periods 05/23/2017  . Amenorrhea 05/23/2017  . Increased body mass index 05/09/2015  . SYNCOPE 06/18/2010     1. Annual physical exam        Plan:            1.  Basic Screening Recommendations The basic screening recommendations for asymptomatic women were discussed with the patient during her visit.  The age-appropriate recommendations were discussed with her and the rational for the tests reviewed.  When I am informed by the patient that another primary care physician has previously obtained the age-appropriate tests and they are up-to-date, only outstanding tests are ordered and referrals given as necessary.  Abnormal results of tests will be discussed with her when all of her results are completed. Pap with co-test performed. Orders Orders Placed This Encounter  Procedures  . Basic metabolic panel  . CBC  . Glucose, random  . TSH  . Lipid panel    No orders of the defined types were placed in this encounter.       F/U  Return in about 1 year (around 07/23/2019) for Annual Physical.  [name], M.D. 07/22/2018 12:53 PM

## 2018-07-22 NOTE — Progress Notes (Signed)
Pt here for annual exam. Pt has no concerns and is doing well. Pt requests pap.

## 2018-07-23 LAB — BASIC METABOLIC PANEL
BUN / CREAT RATIO: 15 (ref 9–23)
BUN: 12 mg/dL (ref 6–20)
CO2: 22 mmol/L (ref 20–29)
CREATININE: 0.79 mg/dL (ref 0.57–1.00)
Calcium: 9.1 mg/dL (ref 8.7–10.2)
Chloride: 102 mmol/L (ref 96–106)
GFR, EST AFRICAN AMERICAN: 111 mL/min/{1.73_m2} (ref >59)
GFR, EST NON AFRICAN AMERICAN: 96 mL/min/{1.73_m2} (ref >59)
Glucose: 96 mg/dL (ref 65–99)
Potassium: 4.2 mmol/L (ref 3.5–5.2)
SODIUM: 138 mmol/L (ref 134–144)

## 2018-07-23 LAB — CBC
Hematocrit: 39.5 % (ref 34.0–46.6)
Hemoglobin: 13.4 g/dL (ref 11.1–15.9)
MCH: 29.7 pg (ref 26.6–33.0)
MCHC: 33.9 g/dL (ref 31.5–35.7)
MCV: 88 fL (ref 79–97)
PLATELETS: 292 10*3/uL (ref 150–450)
RBC: 4.51 x10E6/uL (ref 3.77–5.28)
RDW: 12.7 % (ref 12.3–15.4)
WBC: 6.1 10*3/uL (ref 3.4–10.8)

## 2018-07-23 LAB — LIPID PANEL
Chol/HDL Ratio: 3.8 ratio (ref 0.0–4.4)
Cholesterol, Total: 215 mg/dL — ABNORMAL HIGH (ref 100–199)
HDL: 56 mg/dL (ref >39)
LDL CALC: 135 mg/dL — AB (ref 0–99)
TRIGLYCERIDES: 121 mg/dL (ref 0–149)
VLDL Cholesterol Cal: 24 mg/dL (ref 5–40)

## 2018-07-23 LAB — TSH: TSH: 0.569 u[IU]/mL (ref 0.450–4.500)

## 2018-07-28 LAB — CYTOLOGY - PAP
ADEQUACY: ABSENT
DIAGNOSIS: NEGATIVE
HPV: NOT DETECTED

## 2019-07-26 ENCOUNTER — Encounter: Payer: 59 | Admitting: Obstetrics and Gynecology

## 2019-07-27 ENCOUNTER — Encounter: Payer: 59 | Admitting: Obstetrics and Gynecology

## 2019-08-11 ENCOUNTER — Encounter: Payer: 59 | Admitting: Obstetrics and Gynecology

## 2019-08-31 ENCOUNTER — Encounter: Payer: Self-pay | Admitting: Obstetrics and Gynecology

## 2019-08-31 ENCOUNTER — Ambulatory Visit (INDEPENDENT_AMBULATORY_CARE_PROVIDER_SITE_OTHER): Payer: 59 | Admitting: Obstetrics and Gynecology

## 2019-08-31 ENCOUNTER — Other Ambulatory Visit: Payer: Self-pay

## 2019-08-31 VITALS — BP 180/71 | HR 80 | Ht 65.0 in | Wt 190.5 lb

## 2019-08-31 DIAGNOSIS — Z01419 Encounter for gynecological examination (general) (routine) without abnormal findings: Secondary | ICD-10-CM

## 2019-08-31 NOTE — Progress Notes (Signed)
HPI:      [name] who Mallory Clayton was Mallory Clayton's last menstrual period was 08/12/2019.  Subjective:   She presents today for her annual examination.  She has no specific complaints.  She does state that her periods are a little bit heavier than they were prior to her tubal ligation.  She says they are regular every month.    Hx: The following portions of the Mallory Clayton's history were reviewed and updated as appropriate:             She  has a past medical history of Syncope. She does not have any pertinent problems on file. She  has a past surgical history that includes Cesarean section (2014); Wisdom tooth extraction; and Cesarean section (N/A, 01/05/2018). Her family history includes Breast cancer in her maternal aunt; Colon cancer in her maternal aunt and maternal uncle; Diabetes in her mother; Heart disease in her unknown relative. She  reports that she has never smoked. She has never used smokeless tobacco. She reports that she does not drink alcohol or use drugs. She currently has no medications in their medication list. She has No Known Allergies.       Review of Systems:  Review of Systems  Constitutional: Denied constitutional symptoms, night sweats, recent illness, fatigue, fever, insomnia and weight loss.  Eyes: Denied eye symptoms, eye pain, photophobia, vision change and visual disturbance.  Ears/Nose/Throat/Neck: Denied ear, nose, throat or neck symptoms, hearing loss, nasal discharge, sinus congestion and sore throat.  Cardiovascular: Denied cardiovascular symptoms, arrhythmia, chest pain/pressure, edema, exercise intolerance, orthopnea and palpitations.  Respiratory: Denied pulmonary symptoms, asthma, pleuritic pain, productive sputum, cough, dyspnea and wheezing.  Gastrointestinal: Denied, gastro-esophageal reflux, melena, nausea and vomiting.  Genitourinary: Denied genitourinary symptoms including symptomatic vaginal discharge, pelvic relaxation  issues, and urinary complaints.  Musculoskeletal: Denied musculoskeletal symptoms, stiffness, swelling, muscle weakness and myalgia.  Dermatologic: Denied dermatology symptoms, rash and scar.  Neurologic: Denied neurology symptoms, dizziness, headache, neck pain and syncope.  Psychiatric: Denied psychiatric symptoms, anxiety and depression.  Endocrine: Denied endocrine symptoms including hot flashes and night sweats.   Meds:   No current outpatient medications on file prior to visit.   No current facility-administered medications on file prior to visit.    Objective:     Vitals:   08/31/19 0901  BP: (!) 180/71  Pulse: 80              Physical examination General NAD, Conversant  HEENT Atraumatic; Op clear with mmm.  Normo-cephalic. Pupils reactive. Anicteric sclerae  Thyroid/Neck Smooth without nodularity or enlargement. Normal ROM.  Neck Supple.  Skin No rashes, lesions or ulceration. Normal palpated skin turgor. No nodularity.  Breasts: No masses or discharge.  Symmetric.  No axillary adenopathy.  Lungs: Clear to auscultation.No rales or wheezes. Normal Respiratory effort, no retractions.  Heart: NSR.  No murmurs or rubs appreciated. No periferal edema  Abdomen: Soft.  Non-tender.  No masses.  No HSM. No hernia  Extremities: Moves all appropriately.  Normal ROM for age. No lymphadenopathy.  Neuro: Oriented to PPT.  Normal mood. Normal affect.     Pelvic:   Vulva: Normal appearance.  No lesions.  Vagina: No lesions or abnormalities noted.  Support: Normal pelvic support.  Urethra No masses tenderness or scarring.  Meatus Normal size without lesions or prolapse.  Cervix: Normal appearance.  No lesions.  Anus: Normal exam.  No lesions.  Perineum: Normal exam.  No lesions.  Bimanual   Uterus: Normal size.  Non-tender.  Mobile.  AV.  Adnexae: No masses.  Non-tender to palpation.  Cul-de-sac: Negative for abnormality.      Assessment:    [id number] Mallory Clayton Active  Problem List   Diagnosis Date Noted  . Delivery by cesarean section using transverse incision of lower segment of uterus 01/05/2018  . History of macrosomia in infant in prior pregnancy, currently pregnant 10/28/2017  . Advanced maternal age in multigravida, second trimester 07/22/2017  . Nausea/vomiting in pregnancy 05/23/2017  . History of gestational diabetes 05/23/2017  . Positive urine pregnancy test 05/23/2017  . Fetal macrosomia in first trimester 05/23/2017  . History of postpartum hemorrhage 05/23/2017  . History of cesarean section 05/23/2017  . Irregular periods 05/23/2017  . Amenorrhea 05/23/2017  . Increased body mass index 05/09/2015  . SYNCOPE 06/18/2010     1. Well woman exam with routine gynecological exam        Plan:            1.  Basic Screening Recommendations The basic screening recommendations for asymptomatic women were discussed with the Mallory Clayton during her visit.  The age-appropriate recommendations were discussed with her and the rational for the tests reviewed.  When I am informed by the Mallory Clayton that another primary care physician has previously obtained the age-appropriate tests and they are up-to-date, only outstanding tests are ordered and referrals given as necessary.  Abnormal results of tests will be discussed with her when all of her results are completed.  Routine preventative health maintenance measures emphasized: Exercise/Diet/Weight control, Tobacco Warnings, Alcohol/Substance use risks and Stress Management Discussed mammogram in detail-with no known family history-begin at 53.  Routine annual blood work ordered Orders Orders Placed This Encounter  Procedures  . Lipid Profile  . HgB A1c  . TSH    No orders of the defined types were placed in this encounter.       F/U  Return in about 1 year (around 08/30/2020) for Annual Physical.  [name], M.D. 08/31/2019 9:45 AM

## 2019-08-31 NOTE — Progress Notes (Signed)
Patient comes in for annual exam. She has no concerns today. Pap performed 2019 that was normal. Labs ordered for today.

## 2019-09-01 LAB — LIPID PANEL
Chol/HDL Ratio: 3.2 ratio (ref 0.0–4.4)
Cholesterol, Total: 183 mg/dL (ref 100–199)
HDL: 57 mg/dL (ref >39)
LDL Chol Calc (NIH): 107 mg/dL — ABNORMAL HIGH (ref 0–99)
Triglycerides: 103 mg/dL (ref 0–149)
VLDL Cholesterol Cal: 19 mg/dL (ref 5–40)

## 2019-09-01 LAB — HEMOGLOBIN A1C
Est. average glucose Bld gHb Est-mCnc: 94 mg/dL
Hgb A1c MFr Bld: 4.9 % (ref 4.8–5.6)

## 2019-09-01 LAB — TSH: TSH: 1.09 u[IU]/mL (ref 0.450–4.500)

## 2020-09-05 ENCOUNTER — Encounter: Payer: Self-pay | Admitting: Obstetrics and Gynecology

## 2020-09-05 ENCOUNTER — Ambulatory Visit (INDEPENDENT_AMBULATORY_CARE_PROVIDER_SITE_OTHER): Payer: 59 | Admitting: Obstetrics and Gynecology

## 2020-09-05 ENCOUNTER — Other Ambulatory Visit: Payer: Self-pay

## 2020-09-05 VITALS — BP 120/78 | HR 66 | Ht 65.0 in | Wt 168.1 lb

## 2020-09-05 DIAGNOSIS — Z01419 Encounter for gynecological examination (general) (routine) without abnormal findings: Secondary | ICD-10-CM

## 2020-09-05 NOTE — Progress Notes (Signed)
HPI:      [name] is a 40 y.o. [id number] who LMP was Patient's last menstrual period was 09/03/2020.  Subjective:   She presents today for her annual examination.  She is having normal regular periods.  She has a tubal ligation for birth control.  She has no complaints.    Hx: The following portions of the patient's history were reviewed and updated as appropriate:             She  has a past medical history of Syncope. She does not have any pertinent problems on file. She  has a past surgical history that includes Cesarean section (2014); Wisdom tooth extraction; and Cesarean section (N/A, 01/05/2018). Her family history includes Breast cancer in her maternal aunt; Colon cancer in her maternal aunt and maternal uncle; Diabetes in her mother; Heart disease in her unknown relative. She  reports that she has never smoked. She has never used smokeless tobacco. She reports that she does not drink alcohol and does not use drugs. She currently has no medications in their medication list. She has No Known Allergies.       Review of Systems:  Review of Systems  Constitutional: Denied constitutional symptoms, night sweats, recent illness, fatigue, fever, insomnia and weight loss.  Eyes: Denied eye symptoms, eye pain, photophobia, vision change and visual disturbance.  Ears/Nose/Throat/Neck: Denied ear, nose, throat or neck symptoms, hearing loss, nasal discharge, sinus congestion and sore throat.  Cardiovascular: Denied cardiovascular symptoms, arrhythmia, chest pain/pressure, edema, exercise intolerance, orthopnea and palpitations.  Respiratory: Denied pulmonary symptoms, asthma, pleuritic pain, productive sputum, cough, dyspnea and wheezing.  Gastrointestinal: Denied, gastro-esophageal reflux, melena, nausea and vomiting.  Genitourinary: Denied genitourinary symptoms including symptomatic vaginal discharge, pelvic relaxation issues, and urinary complaints.  Musculoskeletal: Denied  musculoskeletal symptoms, stiffness, swelling, muscle weakness and myalgia.  Dermatologic: Denied dermatology symptoms, rash and scar.  Neurologic: Denied neurology symptoms, dizziness, headache, neck pain and syncope.  Psychiatric: Denied psychiatric symptoms, anxiety and depression.  Endocrine: Denied endocrine symptoms including hot flashes and night sweats.   Meds:   No current outpatient medications on file prior to visit.   No current facility-administered medications on file prior to visit.       The pregnancy intention screening data noted above was reviewed. Potential methods of contraception were discussed. The patient elected to proceed with Female Sterilization.     Objective:     Vitals:   09/05/20 1505  BP: 120/78  Pulse: 66    Filed Weights   09/05/20 1505  Weight: 168 lb 1.6 oz (76.2 kg)              Physical examination General NAD, Conversant  HEENT Atraumatic; Op clear with mmm.  Normo-cephalic. Pupils reactive. Anicteric sclerae  Thyroid/Neck Smooth without nodularity or enlargement. Normal ROM.  Neck Supple.  Skin No rashes, lesions or ulceration. Normal palpated skin turgor. No nodularity.  Breasts: No masses or discharge.  Symmetric.  No axillary adenopathy.  Lungs: Clear to auscultation.No rales or wheezes. Normal Respiratory effort, no retractions.  Heart: NSR.  No murmurs or rubs appreciated. No periferal edema  Abdomen: Soft.  Non-tender.  No masses.  No HSM. No hernia  Extremities: Moves all appropriately.  Normal ROM for age. No lymphadenopathy.  Neuro: Oriented to PPT.  Normal mood. Normal affect.     Pelvic:   Vulva: Normal appearance.  No lesions.  Vagina: No lesions or abnormalities noted.  Support: Normal pelvic support.  Urethra  No masses tenderness or scarring.  Meatus Normal size without lesions or prolapse.  Cervix: Normal appearance.  No lesions.  Anus: Normal exam.  No lesions.  Perineum: Normal exam.  No lesions.         Bimanual   Uterus: Normal size.  Non-tender.  Mobile.  AV.  Adnexae: No masses.  Non-tender to palpation.  Cul-de-sac: Negative for abnormality.      Assessment:    [id number] Patient Active Problem List   Diagnosis Date Noted  . Delivery by cesarean section using transverse incision of lower segment of uterus 01/05/2018  . History of macrosomia in infant in prior pregnancy, currently pregnant 10/28/2017  . Advanced maternal age in multigravida, second trimester 07/22/2017  . Nausea/vomiting in pregnancy 05/23/2017  . History of gestational diabetes 05/23/2017  . Positive urine pregnancy test 05/23/2017  . Fetal macrosomia in first trimester 05/23/2017  . History of postpartum hemorrhage 05/23/2017  . History of cesarean section 05/23/2017  . Irregular periods 05/23/2017  . Amenorrhea 05/23/2017  . Increased body mass index 05/09/2015  . SYNCOPE 06/18/2010     1. Well woman exam with routine gynecological exam     Normal exam   Plan:            1.  Basic Screening Recommendations The basic screening recommendations for asymptomatic women were discussed with the patient during her visit.  The age-appropriate recommendations were discussed with her and the rational for the tests reviewed.  When I am informed by the patient that another primary care physician has previously obtained the age-appropriate tests and they are up-to-date, only outstanding tests are ordered and referrals given as necessary.  Abnormal results of tests will be discussed with her when all of her results are completed.  Routine preventative health maintenance measures emphasized: Exercise/Diet/Weight control, Tobacco Warnings, Alcohol/Substance use risks and Stress Management Patient to return for blood work- mammogram next year- Pap smear next year. Orders Orders Placed This Encounter  Procedures  . Hemoglobin A1c  . Lipid panel  . TSH    No orders of the defined types were placed in this encounter.          F/U  No follow-ups on file.  [name], M.D. 09/05/2020 3:24 PM

## 2020-09-13 ENCOUNTER — Other Ambulatory Visit: Payer: Self-pay

## 2020-09-13 ENCOUNTER — Other Ambulatory Visit: Payer: 59

## 2020-09-14 LAB — LIPID PANEL
Chol/HDL Ratio: 3 ratio (ref 0.0–4.4)
Cholesterol, Total: 181 mg/dL (ref 100–199)
HDL: 61 mg/dL (ref >39)
LDL Chol Calc (NIH): 105 mg/dL — ABNORMAL HIGH (ref 0–99)
Triglycerides: 81 mg/dL (ref 0–149)
VLDL Cholesterol Cal: 15 mg/dL (ref 5–40)

## 2020-09-14 LAB — HEMOGLOBIN A1C
Est. average glucose Bld gHb Est-mCnc: 105 mg/dL
Hgb A1c MFr Bld: 5.3 % (ref 4.8–5.6)

## 2020-09-14 LAB — TSH: TSH: 0.979 u[IU]/mL (ref 0.450–4.500)

## 2020-10-04 NOTE — Progress Notes (Signed)
    Subjective:    CC: Low back pain  I, Mallory Clayton, LAT, ATC, am serving as scribe for Dr. Clementeen Graham.  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 She locates her pain to L low back.  She did not have much pain over the last 3 years until the flareup recently.  She denies any recent injury.  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  She has 4 children the youngest is almost 84 years old.  She does have to pick the child up frequently.  Radiating pain: sometimes around into L hip LE numbness/tingling: no LE weakness: pain when flexing L hip Aggravating factors: flex L hip, roll in bed Treatments tried: naproxen  Pertinent review of Systems: No fevers or chills  Relevant historical information: History of syncope   Objective:    Vitals:   10/05/20 0750  BP: 117/81  Pulse: 63  SpO2: 100%   General: Well Developed, well nourished, and in no acute distress.   MSK: L-spine normal-appearing Nontender midline. Not particularly tender palpation paraspinal musculature. Normal lumbar motion. Lower extremity strength reflexes and sensation are equal normal throughout bilateral lower extremities. Negative slump test bilaterally. Normal gait.   Impression and Recommendations:    Assessment and Plan: 40 y.o. female with left low back pain thought to be due to lumbosacral strain and spasm.  Plan for physical therapy.  Also recommend heating pad and TENS unit.  Recommend continue prescription strength NSAIDs.  Discussed muscle relaxers.  Patient would like to delay that for now which I think is reasonable.  Recheck back with me in 6 weeks especially if not improved.Mallory Clayton  PDMP not reviewed this encounter. Orders Placed This Encounter  Procedures  . Ambulatory referral to Physical Therapy    Referral Priority:   Routine    Referral Type:   Physical Medicine    Referral Reason:    Specialty Services Required    Requested Specialty:   Physical Therapy    Number of Visits Requested:   1   No orders of the defined types were placed in this encounter.   Discussed warning signs or symptoms. Please see discharge instructions. Patient expresses understanding.   The above documentation has been reviewed and is accurate and complete Clementeen Graham, M.D.

## 2020-10-05 ENCOUNTER — Ambulatory Visit (INDEPENDENT_AMBULATORY_CARE_PROVIDER_SITE_OTHER): Payer: 59 | Admitting: Family Medicine

## 2020-10-05 ENCOUNTER — Other Ambulatory Visit: Payer: Self-pay

## 2020-10-05 VITALS — BP 117/81 | HR 63 | Ht 65.0 in | Wt 168.6 lb

## 2020-10-05 DIAGNOSIS — M545 Low back pain, unspecified: Secondary | ICD-10-CM | POA: Diagnosis not present

## 2020-10-05 NOTE — Patient Instructions (Addendum)
Thank you for coming in today.  Please get an Xray today before you leave.  I've referred you to Physical Therapy.  Let us know if you don't hear from them in one week.  Heating pad  TENS unit.   Recheck in 6 weeks especially if not better.   TENS UNIT: This is helpful for muscle pain and spasm.   Search and Purchase a TENS 7000 2nd edition at  www.tenspros.com or www.Amazon.com It should be less than $30.     TENS unit instructions: Do not shower or bathe with the unit on . Turn the unit off before removing electrodes or batteries . If the electrodes lose stickiness add a drop of water to the electrodes after they are disconnected from the unit and place on plastic sheet. If you continued to have difficulty, call the TENS unit company to purchase more electrodes. . Do not apply lotion on the skin area prior to use. Make sure the skin is clean and dry as this will help prolong the life of the electrodes. . After use, always check skin for unusual red areas, rash or other skin difficulties. If there are any skin problems, does not apply electrodes to the same area. . Never remove the electrodes from the unit by pulling the wires. . Do not use the TENS unit or electrodes other than as directed. . Do not change electrode placement without consultating your therapist or physician. Marland Kitchen Keep 2 fingers with between each electrode. . Wear time ratio is 2:1, on to off times.    For example on for 30 minutes off for 15 minutes and then on for 30 minutes off for 15 minutes    Lumbosacral Strain Lumbosacral strain is an injury that causes pain in the lower back (lumbosacral spine). This injury usually happens from overstretching the muscles or ligaments along your spine. Ligaments are cord-like tissues that connect bones to other bones. A strain can affect one or more muscles or ligaments. What are the causes? This condition may be caused by: A hard, direct hit to the back. Overstretching  the lower back muscles. This may result from: A fall. Lifting something heavy. Repetitive movements such as bending or crouching. What increases the risk? The following factors may make you more likely to develop this condition: Participating in sports or activities that involve: A sudden twist of the back. Pushing or pulling motions. Being overweight or obese. Having poor strength and flexibility, especially tight hamstrings or weak muscles in the back or abdomen. Having too much of a curve in the lower back. Having a pelvis that is tilted forward. What are the signs or symptoms? The main symptom of this condition is pain in the lower back, at the site of the strain. Pain may also be felt down one or both legs. How is this diagnosed? This condition is diagnosed based on your symptoms, your medical history, and a physical exam. During the physical exam, your health care provider may push on certain areas of your back to find the source of your pain. You may be asked to bend forward, backward, and side to side to check your pain and range of motion. You may also have imaging tests, such as X-rays and an MRI. How is this treated? This condition may be treated by: Applying heat and cold on the affected area. Taking medicines to help relieve pain and relax your muscles. Taking NSAIDs, such as ibuprofen, to help reduce swelling and discomfort. Doing stretching and strengthening  exercises for your lower back. Symptoms usually improve within several weeks of treatment. However, recovery time varies. When your symptoms improve, gradually return to your normal routine as soon as possible to reduce pain, avoid stiffness, and keep muscle strength. Follow these instructions at home: Medicines Take over-the-counter and prescription medicines only as told by your health care provider. Ask your health care provider if the medicine prescribed to you: Requires you to avoid driving or using heavy  machinery. Can cause constipation. You may need to take these actions to prevent or treat constipation: Drink enough fluid to keep your urine pale yellow. Take over-the-counter or prescription medicines. Eat foods that are high in fiber, such as beans, whole grains, and fresh fruits and vegetables. Limit foods that are high in fat and processed sugars, such as fried or sweet foods. Managing pain, stiffness, and swelling If directed, put ice on the injured area. To do this: Put ice in a plastic bag. Place a towel between your skin and the bag. Leave the ice on for 20 minutes, 2-3 times a day. If directed, apply heat on the affected area as often as told by your health care provider. Use the heat source that your health care provider recommends, such as a moist heat pack or a heating pad. Place a towel between your skin and the heat source. Leave the heat on for 20-30 minutes. Remove the heat if your skin turns bright red. This is especially important if you are unable to feel pain, heat, or cold. You may have a greater risk of getting burned.      Activity Rest as told by your health care provider. Do not stay in bed. Staying in bed for more than 1-2 days can delay your recovery. Return to your normal activities as told by your health care provider. Ask your health care provider what activities are safe for you. Avoid activities that take a lot of energy for as long as told by your health care provider. Do exercises as told by your health care provider. This includes stretching and strengthening exercises. General instructions Sit up and stand up straight. Avoid leaning forward when you sit, or hunching over when you stand. Do not use any products that contain nicotine or tobacco, such as cigarettes, e-cigarettes, and chewing tobacco. If you need help quitting, ask your health care provider. Keep all follow-up visits as told by your health care provider. This is important. How is this  prevented? Use correct form when playing sports and lifting heavy objects. Use good posture when sitting and standing. Maintain a healthy weight. Sleep on a mattress with medium firmness to support your back. Do at least 150 minutes of moderate-intensity exercise each week, such as brisk walking or water aerobics. Try a form of exercise that takes stress off your back, such as swimming or stationary cycling. Maintain physical fitness, including: Strength. Flexibility.   Contact a health care provider if: Your back pain does not improve after several weeks of treatment. Your symptoms get worse. Get help right away if: Your back pain is severe. You cannot stand or walk. You have difficulty controlling when you urinate or when you have a bowel movement. You feel nauseous or you vomit. Your feet or legs get very cold, turn pale, or look blue. You have numbness, tingling, weakness, or problems using your arms or legs. You develop any of the following: Shortness of breath. Dizziness. Pain in your legs. Weakness in your buttocks or legs. Summary Lumbosacral strain  is an injury that causes pain in the lower back (lumbosacral spine). This injury usually happens from overstretching the muscles or ligaments along your spine. This condition may be caused by a direct hit to the lower back or by overstretching the lower back muscles. Symptoms usually improve within several weeks of treatment. This information is not intended to replace advice given to you by your health care provider. Make sure you discuss any questions you have with your health care provider. Document Revised: 01/05/2019 Document Reviewed: 01/05/2019 Elsevier Patient Education  2021 [employer].

## 2020-11-22 ENCOUNTER — Ambulatory Visit: Payer: 59 | Admitting: Family Medicine

## 2020-12-11 ENCOUNTER — Ambulatory Visit
Admission: RE | Admit: 2020-12-11 | Discharge: 2020-12-11 | Disposition: A | Discharge status: Home / Self Care | Payer: 59 | Source: Ambulatory Visit | Attending: Family Medicine | Admitting: Family Medicine

## 2020-12-11 ENCOUNTER — Other Ambulatory Visit: Payer: Self-pay

## 2020-12-11 VITALS — BP 113/78 | HR 97 | Temp 99.1°F | Resp 18 | Ht 65.0 in | Wt 170.0 lb

## 2020-12-11 DIAGNOSIS — R300 Dysuria: Secondary | ICD-10-CM | POA: Diagnosis not present

## 2020-12-11 DIAGNOSIS — R350 Frequency of micturition: Secondary | ICD-10-CM | POA: Diagnosis not present

## 2020-12-11 DIAGNOSIS — N3001 Acute cystitis with hematuria: Secondary | ICD-10-CM | POA: Diagnosis not present

## 2020-12-11 DIAGNOSIS — M7752 Other enthesopathy of left foot: Secondary | ICD-10-CM | POA: Diagnosis not present

## 2020-12-11 DIAGNOSIS — M79605 Pain in left leg: Secondary | ICD-10-CM

## 2020-12-11 LAB — URINALYSIS, COMPLETE (UACMP) WITH MICROSCOPIC
Bilirubin Urine: NEGATIVE
Glucose, UA: NEGATIVE mg/dL
Ketones, ur: NEGATIVE mg/dL
Nitrite: NEGATIVE
Protein, ur: 100 mg/dL — AB
RBC / HPF: >50 RBC/hpf (ref 0–5)
Specific Gravity, Urine: 1.02 (ref 1.005–1.030)
WBC, UA: >50 WBC/hpf (ref 0–5)
pH: 6.5 (ref 5.0–8.0)

## 2020-12-11 MED ORDER — CEPHALEXIN 500 MG PO CAPS: 500.0000 mg | ORAL_CAPSULE | Freq: Two times a day (BID) | ORAL | 0 refills | Status: DC

## 2020-12-11 MED ORDER — DICLOFENAC SODIUM 75 MG PO TBEC: 75.0000 mg | DELAYED_RELEASE_TABLET | Freq: Two times a day (BID) | ORAL | 0 refills | Status: DC | PRN

## 2020-12-11 NOTE — ED Triage Notes (Signed)
Patient c/o left lower leg pain that started last week. Denis injury. Patient also reports urinary urgency and dysuria that started yesterday.

## 2020-12-11 NOTE — Discharge Instructions (Signed)
Ankle pain is due to tendonitis.  Medication as prescribed.  You have a UTI as well. Antibiotic twice daily x 7 days.  Take care  Dr. Adriana Simas

## 2020-12-11 NOTE — ED Provider Notes (Signed)
[facility]    CSN: [id number] Arrival date & time: 12/11/20  1744      History   Chief Complaint Chief Complaint  Patient presents with  . Leg Pain  . Dysuria  . Urinary Frequency   HPI  40 year old female presents with the above complaints.  Patient reports that she has been experiencing left anterior ankle pain since last week.  Occurred after she did a lot of walking.  She states that the area seems to pop or crunch.  Mild swelling.  Denies any discrete injury or trauma.  Patient also reports that she developed urinary symptoms yesterday.  She reports frequency and urgency.  She reports a "low-grade fever".  No significant abdominal pain or flank pain.  No other complaints.  Past Medical History:  Diagnosis Date  . Syncope    suspect vesovagal. normal ECG and QTc. echo 10/11: EF 55-60%, normal diastolic dysfunction, normal wall motion, no significant valvular abnm., no pulmonary htn, normal appearing RV. holter 10/11; occassional eptopic atrial rhythm with rate in 50s, no worrisome events, average HR 83.    Patient Active Problem List   Diagnosis Date Noted  . Delivery by cesarean section using transverse incision of lower segment of uterus 01/05/2018  . History of macrosomia in infant in prior pregnancy, currently pregnant 10/28/2017  . Advanced maternal age in multigravida, second trimester 07/22/2017  . Nausea/vomiting in pregnancy 05/23/2017  . History of gestational diabetes 05/23/2017  . Positive urine pregnancy test 05/23/2017  . Fetal macrosomia in first trimester 05/23/2017  . History of postpartum hemorrhage 05/23/2017  . History of cesarean section 05/23/2017  . Irregular periods 05/23/2017  . Amenorrhea 05/23/2017  . Increased body mass index 05/09/2015  . SYNCOPE 06/18/2010    Past Surgical History:  Procedure Laterality Date  . CESAREAN SECTION  2014  . CESAREAN SECTION N/A 01/05/2018   Procedure: REPEAT CESAREAN SECTION WITH BILATERAL  TUBALIGATION;  Surgeon: Linzie Collin, MD;  Location: [facility] ORS;  Service: Obstetrics;  Laterality: N/A;  . WISDOM TOOTH EXTRACTION      OB History    Gravida  5   Para  4   Term  4   Preterm      AB  1   Living  4     SAB  1   IAB      Ectopic      Multiple  0   Live Births  4            Home Medications    Prior to Admission medications   Medication Sig Start Date End Date Taking? Authorizing Provider  cephALEXin (KEFLEX) 500 MG capsule Take 1 capsule (500 mg total) by mouth 2 (two) times daily. 12/11/20  Yes Pacey Willadsen G, DO  diclofenac (VOLTAREN) 75 MG EC tablet Take 1 tablet (75 mg total) by mouth 2 (two) times daily as needed for mild pain or moderate pain. 12/11/20  Yes Tommie Sams, DO    Family History Family History  Problem Relation Age of Onset  . Diabetes Mother        side of her family  . Heart disease Other        GF - heart trouble   . Colon cancer Maternal Aunt   . Breast cancer Maternal Aunt   . Colon cancer Maternal Uncle   . Coronary artery disease Neg Hx        congenital heart disease, sudden death   .  Ovarian cancer Neg Hx     Social History Social History   Tobacco Use  . Smoking status: Never Smoker  . Smokeless tobacco: Never Used  . Tobacco comment: non smoker  Vaping Use  . Vaping Use: Never used  Substance Use Topics  . Alcohol use: No  . Drug use: No     Allergies   Patient has no known allergies.   Review of Systems Review of Systems  Genitourinary: Positive for frequency and urgency.  Musculoskeletal:       Left ankle.   Physical Exam Triage Vital Signs ED Triage Vitals  Enc Vitals Group     BP 12/11/20 1759 113/78     Pulse Rate 12/11/20 1759 97     Resp 12/11/20 1759 18     Temp 12/11/20 1759 99.1 F (37.3 C)     Temp Source 12/11/20 1759 Oral     SpO2 12/11/20 1759 100 %     Weight 12/11/20 1757 170 lb (77.1 kg)     Height 12/11/20 1757 5\' 5"  (1.651 m)     Head Circumference --       Peak Flow --      Pain Score 12/11/20 1757 6     Pain Loc --      Pain Edu? --      Excl. in [place]? --    Updated Vital Signs BP 113/78 (BP Location: Right Arm)   Pulse 97   Temp 99.1 F (37.3 C) (Oral)   Resp 18   Ht 5\' 5"  (1.651 m)   Wt 77.1 kg   LMP 11/27/2020   SpO2 100%   BMI 28.29 kg/m   Visual Acuity Right Eye Distance:   Left Eye Distance:   Bilateral Distance:    Right Eye Near:   Left Eye Near:    Bilateral Near:     Physical Exam Vitals and nursing note reviewed.  Constitutional:      General: She is not in acute distress.    Appearance: Normal appearance. She is not ill-appearing.  HENT:     Head: Normocephalic and atraumatic.  Eyes:     General:        Right eye: No discharge.        Left eye: No discharge.     Conjunctiva/sclera: Conjunctivae normal.  Pulmonary:     Effort: Pulmonary effort is normal. No respiratory distress.  Musculoskeletal:     Comments: Left ankle - mild tenderness anteriorly.   Neurological:     Mental Status: She is alert.  Psychiatric:        Mood and Affect: Mood normal.        Behavior: Behavior normal.    UC Treatments / Results  Labs (all labs ordered are listed, but only abnormal results are displayed) Labs Reviewed  URINALYSIS, COMPLETE (UACMP) WITH MICROSCOPIC - Abnormal; Notable for the following components:      Result Value   APPearance CLOUDY (*)    Hgb urine dipstick LARGE (*)    Protein, ur 100 (*)    Leukocytes,Ua MODERATE (*)    Bacteria, UA FEW (*)    All other components within normal limits  URINE CULTURE    EKG   Radiology No results found.  Procedures Procedures (including critical care time)  Medications Ordered in UC Medications - No data to display  Initial Impression / Assessment and Plan / UC Course  I have reviewed the triage vital signs and the nursing notes.  Pertinent labs & imaging results that were available during my care of the patient were reviewed by me and considered  in my medical decision making (see chart for details).    40 year old female presents with UTI.  Awaiting culture.  Placing on Keflex.  Also appears to have ankle tendinitis.  Treating with diclofenac.  Final Clinical Impressions(s) / UC Diagnoses   Final diagnoses:  Acute cystitis with hematuria  Left ankle tendonitis     Discharge Instructions     Ankle pain is due to tendonitis.  Medication as prescribed.  You have a UTI as well. Antibiotic twice daily x 7 days.  Take care  Dr. Adriana Simas     ED Prescriptions    Medication Sig Dispense Auth. Provider   cephALEXin (KEFLEX) 500 MG capsule Take 1 capsule (500 mg total) by mouth 2 (two) times daily. 14 capsule Simya Tercero G, DO   diclofenac (VOLTAREN) 75 MG EC tablet Take 1 tablet (75 mg total) by mouth 2 (two) times daily as needed for mild pain or moderate pain. 30 tablet Tommie Sams, DO     PDMP not reviewed this encounter.   Tommie Sams, [place] 12/11/20 1910

## 2020-12-14 LAB — URINE CULTURE: Culture: >=100000 — AB

## 2021-09-11 ENCOUNTER — Encounter: Payer: 59 | Admitting: Obstetrics and Gynecology

## 2021-09-27 ENCOUNTER — Other Ambulatory Visit: Payer: Self-pay

## 2021-09-27 ENCOUNTER — Ambulatory Visit (INDEPENDENT_AMBULATORY_CARE_PROVIDER_SITE_OTHER): Payer: 59 | Admitting: Obstetrics and Gynecology

## 2021-09-27 ENCOUNTER — Other Ambulatory Visit (HOSPITAL_COMMUNITY)
Admission: RE | Admit: 2021-09-27 | Discharge: 2021-09-27 | Disposition: A | Discharge status: Home / Self Care | Payer: 59 | Source: Ambulatory Visit | Attending: Obstetrics and Gynecology | Admitting: Obstetrics and Gynecology

## 2021-09-27 ENCOUNTER — Encounter: Payer: Self-pay | Admitting: Obstetrics and Gynecology

## 2021-09-27 VITALS — BP 111/74 | HR 75 | Ht 65.0 in | Wt 184.7 lb

## 2021-09-27 DIAGNOSIS — Z01419 Encounter for gynecological examination (general) (routine) without abnormal findings: Secondary | ICD-10-CM | POA: Insufficient documentation

## 2021-09-27 DIAGNOSIS — Z1231 Encounter for screening mammogram for malignant neoplasm of breast: Secondary | ICD-10-CM | POA: Diagnosis not present

## 2021-09-27 NOTE — Progress Notes (Signed)
HPI:      [name] is a 41 y.o. [id number] who LMP was Patient's last menstrual period was 09/27/2021 (exact date).  Subjective:   She presents today for her annual examination.  She has no complaints.  She has normal regular cycles.  She has a tubal ligation for birth control.    Hx: The following portions of the patient's history were reviewed and updated as appropriate:             She  has a past medical history of Syncope. She does not have any pertinent problems on file. She  has a past surgical history that includes Cesarean section (2014); Wisdom tooth extraction; and Cesarean section (N/A, 01/05/2018). Her family history includes Breast cancer in her maternal aunt; Colon cancer in her maternal aunt and maternal uncle; Diabetes in her mother; Heart disease in an other family member. She  reports that she has never smoked. She has never used smokeless tobacco. She reports that she does not drink alcohol and does not use drugs. She has a current medication list which includes the following prescription(s): multivitamin, cephalexin, and diclofenac. She has No Known Allergies.       Review of Systems:  Review of Systems  Constitutional: Denied constitutional symptoms, night sweats, recent illness, fatigue, fever, insomnia and weight loss.  Eyes: Denied eye symptoms, eye pain, photophobia, vision change and visual disturbance.  Ears/Nose/Throat/Neck: Denied ear, nose, throat or neck symptoms, hearing loss, nasal discharge, sinus congestion and sore throat.  Cardiovascular: Denied cardiovascular symptoms, arrhythmia, chest pain/pressure, edema, exercise intolerance, orthopnea and palpitations.  Respiratory: Denied pulmonary symptoms, asthma, pleuritic pain, productive sputum, cough, dyspnea and wheezing.  Gastrointestinal: Denied, gastro-esophageal reflux, melena, nausea and vomiting.  Genitourinary: Denied genitourinary symptoms including symptomatic vaginal discharge, pelvic  relaxation issues, and urinary complaints.  Musculoskeletal: Denied musculoskeletal symptoms, stiffness, swelling, muscle weakness and myalgia.  Dermatologic: Denied dermatology symptoms, rash and scar.  Neurologic: Denied neurology symptoms, dizziness, headache, neck pain and syncope.  Psychiatric: Denied psychiatric symptoms, anxiety and depression.  Endocrine: Denied endocrine symptoms including hot flashes and night sweats.   Meds:   Current Outpatient Medications on File Prior to Visit  Medication Sig Dispense Refill   Multiple Vitamin (MULTIVITAMIN) capsule Take 1 capsule by mouth daily.     cephALEXin (KEFLEX) 500 MG capsule Take 1 capsule (500 mg total) by mouth 2 (two) times daily. (Patient not taking: Reported on 09/27/2021) 14 capsule 0   diclofenac (VOLTAREN) 75 MG EC tablet Take 1 tablet (75 mg total) by mouth 2 (two) times daily as needed for mild pain or moderate pain. (Patient not taking: Reported on 09/27/2021) 30 tablet 0   No current facility-administered medications on file prior to visit.     Objective:     Vitals:   09/27/21 0812  BP: 111/74  Pulse: 75    Filed Weights   09/27/21 0812  Weight: 184 lb 11.2 oz (83.8 kg)              Physical examination General NAD, Conversant  HEENT Atraumatic; Op clear with mmm.  Normo-cephalic. Pupils reactive. Anicteric sclerae  Thyroid/Neck Smooth without nodularity or enlargement. Normal ROM.  Neck Supple.  Skin No rashes, lesions or ulceration. Normal palpated skin turgor. No nodularity.  Breasts: No masses or discharge.  Symmetric.  No axillary adenopathy.  Lungs: Clear to auscultation.No rales or wheezes. Normal Respiratory effort, no retractions.  Heart: NSR.  No murmurs or rubs appreciated. No periferal edema  Abdomen: Soft.  Non-tender.  No masses.  No HSM. No hernia  Extremities: Moves all appropriately.  Normal ROM for age. No lymphadenopathy.  Neuro: Oriented to PPT.  Normal mood. Normal affect.     Pelvic:    Vulva: Normal appearance.  No lesions.  Vagina: No lesions or abnormalities noted.  Support: Normal pelvic support.  Urethra No masses tenderness or scarring.  Meatus Normal size without lesions or prolapse.  Cervix: Normal appearance.  No lesions.  Anus: Normal exam.  No lesions.  Perineum: Normal exam.  No lesions.        Bimanual   Uterus: Normal size.  Non-tender.  Mobile.  AV.  Adnexae: No masses.  Non-tender to palpation.  Cul-de-sac: Negative for abnormality.     Assessment:    [id number] Patient Active Problem List   Diagnosis Date Noted   Delivery by cesarean section using transverse incision of lower segment of uterus 01/05/2018   History of macrosomia in infant in prior pregnancy, currently pregnant 10/28/2017   Advanced maternal age in multigravida, second trimester 07/22/2017   Nausea/vomiting in pregnancy 05/23/2017   History of gestational diabetes 05/23/2017   Positive urine pregnancy test 05/23/2017   Fetal macrosomia in first trimester 05/23/2017   History of postpartum hemorrhage 05/23/2017   History of cesarean section 05/23/2017   Irregular periods 05/23/2017   Amenorrhea 05/23/2017   Increased body mass index 05/09/2015   SYNCOPE 06/18/2010     1. Well woman exam with routine gynecological exam   2. Encounter for screening mammogram for malignant neoplasm of breast     Normal exam.   Plan:            1.  Basic Screening Recommendations The basic screening recommendations for asymptomatic women were discussed with the patient during her visit.  The age-appropriate recommendations were discussed with her and the rational for the tests reviewed.  When I am informed by the patient that another primary care physician has previously obtained the age-appropriate tests and they are up-to-date, only outstanding tests are ordered and referrals given as necessary.  Abnormal results of tests will be discussed with her when all of her results are completed.   Routine preventative health maintenance measures emphasized: Exercise/Diet/Weight control, Tobacco Warnings, Alcohol/Substance use risks and Stress Management Pap Co-test performed today-patient due for mammogram-ordered blood work today. Orders Orders Placed This Encounter  Procedures   MM DIGITAL SCREENING BILATERAL   Basic metabolic panel   CBC   Cholesterol, total   TSH   Lipid panel   Hemoglobin A1c    No orders of the defined types were placed in this encounter.         F/U  Return in about 1 year (around 09/27/2022) for Annual Physical.  [name], M.D. 09/27/2021 8:43 AM

## 2021-09-27 NOTE — Progress Notes (Signed)
Patient presents for annual exam. Patient states lmp started this morning. Patient states no questions or concerns.

## 2021-09-28 LAB — CBC
Hematocrit: 40.9 % (ref 34.0–46.6)
Hemoglobin: 13.5 g/dL (ref 11.1–15.9)
MCH: 30.7 pg (ref 26.6–33.0)
MCHC: 33 g/dL (ref 31.5–35.7)
MCV: 93 fL (ref 79–97)
Platelets: 284 10*3/uL (ref 150–450)
RBC: 4.4 x10E6/uL (ref 3.77–5.28)
RDW: 12.1 % (ref 11.7–15.4)
WBC: 6.3 10*3/uL (ref 3.4–10.8)

## 2021-09-28 LAB — BASIC METABOLIC PANEL
BUN/Creatinine Ratio: 16 (ref 9–23)
BUN: 13 mg/dL (ref 6–24)
CO2: 23 mmol/L (ref 20–29)
Calcium: 9.3 mg/dL (ref 8.7–10.2)
Chloride: 104 mmol/L (ref 96–106)
Creatinine, Ser: 0.79 mg/dL (ref 0.57–1.00)
Glucose: 84 mg/dL (ref 70–99)
Potassium: 4.3 mmol/L (ref 3.5–5.2)
Sodium: 141 mmol/L (ref 134–144)
eGFR: 97 mL/min/{1.73_m2} (ref >59)

## 2021-09-28 LAB — TSH: TSH: 0.963 u[IU]/mL (ref 0.450–4.500)

## 2021-09-28 LAB — HEMOGLOBIN A1C
Est. average glucose Bld gHb Est-mCnc: 108 mg/dL
Hgb A1c MFr Bld: 5.4 % (ref 4.8–5.6)

## 2021-09-28 LAB — LIPID PANEL
Chol/HDL Ratio: 2.9 ratio (ref 0.0–4.4)
Cholesterol, Total: 186 mg/dL (ref 100–199)
HDL: 64 mg/dL (ref >39)
LDL Chol Calc (NIH): 108 mg/dL — ABNORMAL HIGH (ref 0–99)
Triglycerides: 77 mg/dL (ref 0–149)
VLDL Cholesterol Cal: 14 mg/dL (ref 5–40)

## 2021-10-01 LAB — CYTOLOGY - PAP
Comment: NEGATIVE
Diagnosis: NEGATIVE
High risk HPV: NEGATIVE

## 2022-05-26 ENCOUNTER — Ambulatory Visit
Admission: EM | Admit: 2022-05-26 | Discharge: 2022-05-26 | Disposition: A | Discharge status: Home / Self Care | Payer: 59 | Attending: Physician Assistant | Admitting: Physician Assistant

## 2022-05-26 ENCOUNTER — Encounter: Payer: Self-pay | Admitting: Emergency Medicine

## 2022-05-26 DIAGNOSIS — R1011 Right upper quadrant pain: Secondary | ICD-10-CM | POA: Diagnosis not present

## 2022-05-26 LAB — COMPREHENSIVE METABOLIC PANEL
ALT: 16 U/L (ref 0–44)
AST: 20 U/L (ref 15–41)
Albumin: 3.9 g/dL (ref 3.5–5.0)
Alkaline Phosphatase: 36 U/L — ABNORMAL LOW (ref 38–126)
Anion gap: 5 (ref 5–15)
BUN: 13 mg/dL (ref 6–20)
CO2: 24 mmol/L (ref 22–32)
Calcium: 9 mg/dL (ref 8.9–10.3)
Chloride: 109 mmol/L (ref 98–111)
Creatinine, Ser: 0.76 mg/dL (ref 0.44–1.00)
GFR, Estimated: >60 mL/min (ref >60)
Glucose, Bld: 99 mg/dL (ref 70–99)
Potassium: 4.4 mmol/L (ref 3.5–5.1)
Sodium: 138 mmol/L (ref 135–145)
Total Bilirubin: 0.4 mg/dL (ref 0.3–1.2)
Total Protein: 7.5 g/dL (ref 6.5–8.1)

## 2022-05-26 LAB — CBC WITH DIFFERENTIAL/PLATELET
Abs Immature Granulocytes: 0.01 10*3/uL (ref 0.00–0.07)
Basophils Absolute: 0 10*3/uL (ref 0.0–0.1)
Basophils Relative: 1 %
Eosinophils Absolute: 0.2 10*3/uL (ref 0.0–0.5)
Eosinophils Relative: 2 %
HCT: 41 % (ref 36.0–46.0)
Hemoglobin: 13.9 g/dL (ref 12.0–15.0)
Immature Granulocytes: 0 %
Lymphocytes Relative: 32 %
Lymphs Abs: 2.3 10*3/uL (ref 0.7–4.0)
MCH: 30.2 pg (ref 26.0–34.0)
MCHC: 33.9 g/dL (ref 30.0–36.0)
MCV: 88.9 fL (ref 80.0–100.0)
Monocytes Absolute: 0.5 10*3/uL (ref 0.1–1.0)
Monocytes Relative: 7 %
Neutro Abs: 4.2 10*3/uL (ref 1.7–7.7)
Neutrophils Relative %: 58 %
Platelets: 282 10*3/uL (ref 150–400)
RBC: 4.61 MIL/uL (ref 3.87–5.11)
RDW: 12.7 % (ref 11.5–15.5)
WBC: 7.2 10*3/uL (ref 4.0–10.5)
nRBC: 0 % (ref 0.0–0.2)

## 2022-05-26 LAB — LIPASE, BLOOD: Lipase: 31 U/L (ref 11–51)

## 2022-05-26 NOTE — ED Triage Notes (Signed)
Patient c/o RUQ abdominal pain that started yesterday and worsen this morning.  Patient denies N/V.

## 2022-05-26 NOTE — Discharge Instructions (Addendum)
-  Your lab work is all normal today.  Your white blood cell count is normal and your liver enzymes are also normal. - The way you describe the pain is concerning for gallstones.  Contact your PCP so that you may make an appointment.  You likely will need ultrasound but if your pain returns and continues for several hours, pain worsens, you experience fever, nausea/vomiting, dizziness, chest pain, shortness of breath, flank pain, urinary symptoms, please do not wait to see your PCP and go to the emergency department for immediate evaluation.

## 2022-05-26 NOTE — ED Provider Notes (Signed)
[facility]    CSN: [id number] Arrival date & time: 05/26/22  0855      History   Chief Complaint Chief Complaint  Patient presents with   Abdominal Pain    RUQ    HPI CARESA Mallory Clayton is a 41 y.o. female presenting for approximately 1 week history of intermittent right upper quadrant abdominal pains which she describes as "cramping."  She says this morning she was woken up at about 2 AM with severe cramping abdominal pain that lasted for couple of hours and went away.  Now she says she is not really having pain in the area but thought she should get it checked out.  She denies any associated fever, fatigue, nausea/vomiting or diarrhea.  Denies any chest pain, shortness of breath.  Has not had any urinary symptoms.  No history of gallstones, kidney stones.  Patient does not take any routine medications.  Medical history significant for recurrent strep throat.  Patient is scheduled to have tonsillectomy in about 2 months.  HPI  Past Medical History:  Diagnosis Date   Syncope    suspect vesovagal. normal ECG and QTc. echo 10/11: EF 0000000, normal diastolic dysfunction, normal wall motion, no significant valvular abnm., no pulmonary htn, normal appearing RV. holter 10/11; occassional eptopic atrial rhythm with rate in 50s, no worrisome events, average HR 83.    Patient Active Problem List   Diagnosis Date Noted   Delivery by cesarean section using transverse incision of lower segment of uterus 01/05/2018   History of macrosomia in infant in prior pregnancy, currently pregnant 10/28/2017   Advanced maternal age in multigravida, second trimester 07/22/2017   Nausea/vomiting in pregnancy 05/23/2017   History of gestational diabetes 05/23/2017   Positive urine pregnancy test 05/23/2017   Fetal macrosomia in first trimester 05/23/2017   History of postpartum hemorrhage 05/23/2017   History of cesarean section 05/23/2017   Irregular periods 05/23/2017   Amenorrhea  05/23/2017   Increased body mass index 05/09/2015   SYNCOPE 06/18/2010    Past Surgical History:  Procedure Laterality Date   CESAREAN SECTION  08/26/2012   CESAREAN SECTION N/A 01/05/2018   Procedure: REPEAT CESAREAN SECTION WITH BILATERAL TUBALIGATION;  Surgeon: Harlin Heys, MD;  Location: [facility] ORS;  Service: Obstetrics;  Laterality: N/A;   TUBAL LIGATION     WISDOM TOOTH EXTRACTION      OB History     Gravida  5   Para  4   Term  4   Preterm      AB  1   Living  4      SAB  1   IAB      Ectopic      Multiple  0   Live Births  4            Home Medications    Prior to Admission medications   Medication Sig Start Date End Date Taking? Authorizing Provider  Multiple Vitamin (MULTIVITAMIN) capsule Take 1 capsule by mouth daily.    [provider]    Family History Family History  Problem Relation Age of Onset   Diabetes Mother        side of her family   Heart disease Other        GF - heart trouble    Colon cancer Maternal Aunt    Breast cancer Maternal Aunt    Colon cancer Maternal Uncle    Coronary artery disease Neg Hx  congenital heart disease, sudden death    Ovarian cancer Neg Hx     Social History Social History   Tobacco Use   Smoking status: Never   Smokeless tobacco: Never   Tobacco comments:    non smoker  Vaping Use   Vaping Use: Never used  Substance Use Topics   Alcohol use: No   Drug use: No     Allergies   Patient has no known allergies.   Review of Systems Review of Systems  Constitutional:  Negative for appetite change, diaphoresis, fatigue and fever.  Respiratory:  Negative for shortness of breath.   Cardiovascular:  Negative for chest pain.  Gastrointestinal:  Positive for abdominal pain. Negative for blood in stool, constipation, diarrhea, nausea and vomiting.  Genitourinary:  Negative for difficulty urinating, dysuria, flank pain, frequency, hematuria, pelvic pain, urgency and  vaginal discharge.  Musculoskeletal:  Negative for back pain and myalgias.     Physical Exam Triage Vital Signs ED Triage Vitals  Enc Vitals Group     BP      Pulse      Resp      Temp      Temp src      SpO2      Weight      Height      Head Circumference      Peak Flow      Pain Score      Pain Loc      Pain Edu?      Excl. in [place]?    No data found.  Updated Vital Signs BP 115/78 (BP Location: Left Arm)   Pulse 88   Temp 98.9 F (37.2 C) (Oral)   Resp 14   Ht 5\' 5"  (1.651 m)   Wt 185 lb (83.9 kg)   LMP 04/29/2022 (Approximate)   SpO2 96%   Breastfeeding No   BMI 30.79 kg/m     Physical Exam Vitals and nursing note reviewed.  Constitutional:      General: She is not in acute distress.    Appearance: Normal appearance. She is not ill-appearing or toxic-appearing.  HENT:     Head: Normocephalic and atraumatic.     Nose: Nose normal.     Mouth/Throat:     Mouth: Mucous membranes are moist.     Pharynx: Oropharynx is clear.  Eyes:     General: No scleral icterus.       Right eye: No discharge.        Left eye: No discharge.     Conjunctiva/sclera: Conjunctivae normal.  Cardiovascular:     Rate and Rhythm: Normal rate and regular rhythm.     Heart sounds: Normal heart sounds.  Pulmonary:     Effort: Pulmonary effort is normal. No respiratory distress.     Breath sounds: Normal breath sounds.  Abdominal:     Palpations: Abdomen is soft.     Tenderness: There is abdominal tenderness (RUQ, mild. Negative Murphy sign). There is no right CVA tenderness, left CVA tenderness or guarding.  Musculoskeletal:     Cervical back: Neck supple.  Skin:    General: Skin is dry.  Neurological:     General: No focal deficit present.     Mental Status: She is alert. Mental status is at baseline.     Motor: No weakness.     Gait: Gait normal.  Psychiatric:        Mood and Affect: Mood normal.  Behavior: Behavior normal.        Thought Content: Thought content  normal.      UC Treatments / Results  Labs (all labs ordered are listed, but only abnormal results are displayed) Labs Reviewed  COMPREHENSIVE METABOLIC PANEL - Abnormal; Notable for the following components:      Result Value   Alkaline Phosphatase 36 (*)    All other components within normal limits  CBC WITH DIFFERENTIAL/PLATELET  LIPASE, BLOOD    EKG   Radiology No results found.  Procedures Procedures (including critical care time)  Medications Ordered in UC Medications - No data to display  Initial Impression / Assessment and Plan / UC Course  I have reviewed the triage vital signs and the nursing notes.  Pertinent labs & imaging results that were available during my care of the patient were reviewed by me and considered in my medical decision making (see chart for details).   41 year old female presents for right upper quadrant abdominal pain that she describes as cramping that has been intermittent for the past 1 week.  Patient reportedly was very intense and lasted for a couple of hours very early this morning.  She says she is not really having any pain at this time.  She never had any associated fever, fatigue, chest pain, shortness of breath, nausea/vomiting/diarrhea, urinary symptoms, vaginal discharge.  Has not been seen for this condition yet.  No history of gallstones, kidney stones.  Vitals are all stable and she is overall well-appearing.  On exam abdomen is soft and she has some mild tenderness to palpation of the right upper quadrant but negative Murphy sign and no rebound or guarding.  No CVA tenderness.  Chest clear auscultation heart regular rate and rhythm.  CBC and CMP obtained as well as lipase.  CBC is normal.  Left is normal.  CMP shows no acute abnormalities.  Discussed all these labs with patient.  Patient's clinical presentation is most consistent with biliary colic.  I do not really have any suspicion for cholecystitis/choledocholithiasis given  her labs and physical exam today.  However, I did caution her about those symptoms and if they occur go immediately to the emergency department otherwise, we discussed smaller meals, avoiding fatty greasy foods, rest, fluids, Tylenol and/or Motrin as needed for discomfort when it occurs and to contact PCP for immediate follow-up as she should have an ultrasound of the right upper quadrant.   Final Clinical Impressions(s) / UC Diagnoses   Final diagnoses:  Right upper quadrant abdominal pain     Discharge Instructions      -Your lab work is all normal today.  Your white blood cell count is normal and your liver enzymes are also normal. - The way you describe the pain is concerning for gallstones.  Contact your PCP so that you may make an appointment.  You likely will need ultrasound but if your pain returns and continues for several hours, pain worsens, you experience fever, nausea/vomiting, dizziness, chest pain, shortness of breath, flank pain, urinary symptoms, please do not wait to see your PCP and go to the emergency department for immediate evaluation.     ED Prescriptions   None    PDMP not reviewed this encounter.   Danton Clap, PA-C 05/26/22 [phone]

## 2022-08-12 ENCOUNTER — Ambulatory Visit: Admit: 2022-08-12 | Discharge status: Absent without leave | Payer: 59

## 2022-09-19 ENCOUNTER — Telehealth: Payer: Self-pay | Admitting: Obstetrics and Gynecology

## 2022-09-19 NOTE — Telephone Encounter (Signed)
Pt responded via [place] after seeing change in her appt from 10/19/2022 to 10/10/2022.  I called pt to explain that the appt had been changed bc of a provider schedule change.  I indicated to pt that if the appt did not work for her, we could get the appt rescheduled.

## 2022-09-19 NOTE — Telephone Encounter (Signed)
Called patient to let her know that we needed to reschedule her appointment on 09-27-22. I left a message letting her know that I moved her to his 1st available at is 10-10-22 at 8:00am. Asked patient if the new time didn't work for her to call office back.

## 2022-09-20 ENCOUNTER — Telehealth: Payer: Self-pay | Admitting: Obstetrics and Gynecology

## 2022-09-20 NOTE — Telephone Encounter (Signed)
Spoke with patient regarding mammogram. All done.

## 2022-09-20 NOTE — Telephone Encounter (Signed)
Pt had her annual last year on 09/27/2021 with Dr. Amalia Hailey.  She did not get a mammogram last year and the order that is in says 'cancelled".  Can she get another order for the mammogram?

## 2022-09-27 ENCOUNTER — Encounter: Payer: 59 | Admitting: Obstetrics and Gynecology

## 2022-10-08 ENCOUNTER — Other Ambulatory Visit: Payer: Self-pay | Admitting: Obstetrics and Gynecology

## 2022-10-08 DIAGNOSIS — Z1231 Encounter for screening mammogram for malignant neoplasm of breast: Secondary | ICD-10-CM

## 2022-10-10 ENCOUNTER — Ambulatory Visit: Payer: 59 | Admitting: Obstetrics and Gynecology

## 2022-10-10 DIAGNOSIS — Z1231 Encounter for screening mammogram for malignant neoplasm of breast: Secondary | ICD-10-CM

## 2022-10-10 DIAGNOSIS — Z01419 Encounter for gynecological examination (general) (routine) without abnormal findings: Secondary | ICD-10-CM

## 2022-10-25 ENCOUNTER — Ambulatory Visit
Admission: RE | Admit: 2022-10-25 | Discharge: 2022-10-25 | Disposition: A | Discharge status: Home / Self Care | Payer: 59 | Source: Ambulatory Visit | Attending: Obstetrics and Gynecology | Admitting: Obstetrics and Gynecology

## 2022-10-25 DIAGNOSIS — Z1231 Encounter for screening mammogram for malignant neoplasm of breast: Secondary | ICD-10-CM | POA: Insufficient documentation

## 2023-01-03 ENCOUNTER — Ambulatory Visit
Admission: EM | Admit: 2023-01-03 | Discharge: 2023-01-03 | Disposition: A | Discharge status: Home / Self Care | Payer: 59 | Attending: Physician Assistant | Admitting: Physician Assistant

## 2023-01-03 ENCOUNTER — Encounter: Payer: Self-pay | Admitting: Emergency Medicine

## 2023-01-03 DIAGNOSIS — R42 Dizziness and giddiness: Secondary | ICD-10-CM | POA: Diagnosis not present

## 2023-01-03 DIAGNOSIS — J029 Acute pharyngitis, unspecified: Secondary | ICD-10-CM | POA: Diagnosis present

## 2023-01-03 LAB — GROUP A STREP BY PCR: Group A Strep by PCR: NOT DETECTED

## 2023-01-03 MED ORDER — IPRATROPIUM BROMIDE 0.06 % NA SOLN: 2.0000 | Freq: Four times a day (QID) | NASAL | 0 refills | Status: DC

## 2023-01-03 MED ORDER — MECLIZINE HCL 25 MG PO TABS: 25.0000 mg | ORAL_TABLET | Freq: Three times a day (TID) | ORAL | 0 refills | Status: DC | PRN

## 2023-01-03 NOTE — ED Provider Notes (Signed)
[facility]    CSN: [id number] Arrival date & time: 01/03/23  0908      History   Chief Complaint Chief Complaint  Patient presents with   Sore Throat   Near Syncope    HPI Mallory Clayton is a 42 y.o. female presenting for sore throat x 3 weeks as well as congestion.  Denies fever, cough, sinus pain, ear pain or pressure, headaches.  Reports occasional dizziness.  She states that 2 days ago she was walking and then "passed out."  She says she tried to catch herself and sit down on a table.  Reports she sore to side on the corner and then fell to the ground.  Her husband reports that he was by her side within 15 seconds of the fall.  States she was "kind of looking dazed."  Patient reports she has a history of vasovagal syncope.  She has had EKGs, Holter monitors, echoes.  She says she will have these episodes when she gets stressed or sick usually.  She has concern for strep throat because she has had syncope with strep throat.  Reports a history of recurrent strep throat and states that she had tonsillectomy in December of last year for this.  She last had strep infection 4 months ago.  She says her throat pain does not feel that badly at this time.  She has been using Flonase because she thought the symptoms could be due to allergies but it has not helped.  At this time she denies any severe headaches, vision changes, vomiting, gait instability, confusion, chest pain, palpitations, shortness of breath.  No other complaints or concerns.  HPI  Past Medical History:  Diagnosis Date   Syncope    suspect vesovagal. normal ECG and QTc. echo 10/11: EF 55-60%, normal diastolic dysfunction, normal wall motion, no significant valvular abnm., no pulmonary htn, normal appearing RV. holter 10/11; occassional eptopic atrial rhythm with rate in 50s, no worrisome events, average HR 83.    Patient Active Problem List   Diagnosis Date Noted   Delivery by cesarean section using transverse  incision of lower segment of uterus 01/05/2018   History of macrosomia in infant in prior pregnancy, currently pregnant 10/28/2017   Advanced maternal age in multigravida, second trimester 07/22/2017   Nausea/vomiting in pregnancy 05/23/2017   History of gestational diabetes 05/23/2017   Positive urine pregnancy test 05/23/2017   Fetal macrosomia in first trimester 05/23/2017   History of postpartum hemorrhage 05/23/2017   History of cesarean section 05/23/2017   Irregular periods 05/23/2017   Amenorrhea 05/23/2017   Increased body mass index 05/09/2015   SYNCOPE 06/18/2010    Past Surgical History:  Procedure Laterality Date   CESAREAN SECTION  08/26/2012   CESAREAN SECTION N/A 01/05/2018   Procedure: REPEAT CESAREAN SECTION WITH BILATERAL TUBALIGATION;  Surgeon: Linzie Collin, MD;  Location: [facility] ORS;  Service: Obstetrics;  Laterality: N/A;   TONSILLECTOMY     TUBAL LIGATION     WISDOM TOOTH EXTRACTION      OB History     Gravida  5   Para  4   Term  4   Preterm      AB  1   Living  4      SAB  1   IAB      Ectopic      Multiple  0   Live Births  4            Home  Medications    Prior to Admission medications   Medication Sig Start Date End Date Taking? Authorizing Provider  ipratropium (ATROVENT) 0.06 % nasal spray Place 2 sprays into both nostrils 4 (four) times daily. 01/03/23  Yes Shirlee Latch, PA-C  meclizine (ANTIVERT) 25 MG tablet Take 1 tablet (25 mg total) by mouth 3 (three) times daily as needed for dizziness or nausea. 01/03/23  Yes Shirlee Latch, PA-C  Multiple Vitamin (MULTIVITAMIN) capsule Take 1 capsule by mouth daily.    [provider]    Family History Family History  Problem Relation Age of Onset   Diabetes Mother        side of her family   Heart disease Other        GF - heart trouble    Colon cancer Maternal Aunt    Breast cancer Maternal Aunt    Colon cancer Maternal Uncle    Coronary artery disease  Neg Hx        congenital heart disease, sudden death    Ovarian cancer Neg Hx     Social History Social History   Tobacco Use   Smoking status: Never   Smokeless tobacco: Never   Tobacco comments:    non smoker  Vaping Use   Vaping Use: Never used  Substance Use Topics   Alcohol use: No   Drug use: No     Allergies   Patient has no known allergies.   Review of Systems Review of Systems  Constitutional:  Negative for chills, diaphoresis, fatigue and fever.  HENT:  Positive for congestion and sore throat. Negative for ear pain, rhinorrhea, sinus pressure and sinus pain.   Respiratory:  Negative for cough and shortness of breath.   Cardiovascular:  Negative for chest pain and palpitations.  Gastrointestinal:  Negative for abdominal pain, nausea and vomiting.  Musculoskeletal:  Negative for arthralgias and myalgias.  Skin:  Negative for rash.  Neurological:  Positive for dizziness and syncope. Negative for weakness and headaches.  Hematological:  Negative for adenopathy.     Physical Exam Triage Vital Signs ED Triage Vitals  Enc Vitals Group     BP      Pulse      Resp      Temp      Temp src      SpO2      Weight      Height      Head Circumference      Peak Flow      Pain Score      Pain Loc      Pain Edu?      Excl. in GC?    No data found.  Updated Vital Signs BP 121/80 (BP Location: Right Arm)   Pulse 70   Temp 98.7 F (37.1 C) (Oral)   Resp 14   Ht 5\' 5"  (1.651 m)   Wt 190 lb (86.2 kg)   LMP 12/07/2022 (Approximate)   SpO2 98%   BMI 31.62 kg/m       Physical Exam Vitals and nursing note reviewed.  Constitutional:      General: She is not in acute distress.    Appearance: Normal appearance. She is not ill-appearing or toxic-appearing.  HENT:     Head: Normocephalic and atraumatic.     Right Ear: Ear canal and external ear normal. A middle ear effusion is present.     Left Ear: Ear canal and external ear normal. A middle ear  effusion  is present.     Nose: Congestion present.     Mouth/Throat:     Mouth: Mucous membranes are moist.     Pharynx: Oropharynx is clear. Posterior oropharyngeal erythema present.  Eyes:     General: No scleral icterus.       Right eye: No discharge.        Left eye: No discharge.     Conjunctiva/sclera: Conjunctivae normal.  Cardiovascular:     Rate and Rhythm: Normal rate and regular rhythm.     Heart sounds: Normal heart sounds.  Pulmonary:     Effort: Pulmonary effort is normal. No respiratory distress.     Breath sounds: Normal breath sounds. No wheezing, rhonchi or rales.  Musculoskeletal:     Cervical back: Neck supple.  Skin:    General: Skin is dry.  Neurological:     General: No focal deficit present.     Mental Status: She is alert and oriented to person, place, and time. Mental status is at baseline.     Motor: No weakness.     Coordination: Coordination normal.     Gait: Gait normal.  Psychiatric:        Mood and Affect: Mood normal.        Behavior: Behavior normal.        Thought Content: Thought content normal.      UC Treatments / Results  Labs (all labs ordered are listed, but only abnormal results are displayed) Labs Reviewed  GROUP A STREP BY PCR    EKG   Radiology No results found.  Procedures Procedures (including critical care time)  Medications Ordered in UC Medications - No data to display  Initial Impression / Assessment and Plan / UC Course  I have reviewed the triage vital signs and the nursing notes.  Pertinent labs & imaging results that were available during my care of the patient were reviewed by me and considered in my medical decision making (see chart for details).   42 year old female presents for sore throat, congestion x 3 weeks.  Patient also reports that 2 days ago she had an episode of syncope which lasted for couple seconds after she fell at home.  She says this is not new to her and she has a history of vasovagal syncope.   She has not had any episodes since.  Vitals are all normal and stable and she is overall well-appearing.  HEENT exam is significant for nasal congestion, mild posterior pharyngeal erythema with clear postnasal drainage and clear effusion of bilateral TMs.  Chest clear to auscultation heart regular rate and rhythm.  Cranial nerves grossly intact and she is alert and oriented x 3 and at baseline.  PCR strep is negative.  Advised patient strep test is negative.  Sore throat likely related to postnasal drainage.  Sent Atrovent nasal spray to pharmacy.  In regards to the dizziness, likely related to eustachian tube dysfunction and effusion of TMs.  The Atrovent nasal spray should help with this as well as meclizine.  Discussed drinking plenty of fluids and being careful position changes.  Advise going to ED if she has another episode like this.  Advised following up with PCP if no improvement in symptoms over the next week.   Final Clinical Impressions(s) / UC Diagnoses   Final diagnoses:  Sore throat  Dizziness     Discharge Instructions      -Strep is negative. Sore throat likely due to post nasal drainage. I  sent a different nasal spray.  -Dizziness and passing out can be related to eustachian tube dysfunction and congestion. I sent meclizine to pharmacy. -Increase rest and fluids. Careful with position changes.  -F/u with PCP if no improvement in symptoms over the next 1 week. -Go to ED if you pass out again or if you have severe headaches, weakness, chest pain, shortness of breath, racing heart.     ED Prescriptions     Medication Sig Dispense Auth. Provider   meclizine (ANTIVERT) 25 MG tablet Take 1 tablet (25 mg total) by mouth 3 (three) times daily as needed for dizziness or nausea. 30 tablet Eusebio Friendly B, PA-C   ipratropium (ATROVENT) 0.06 % nasal spray Place 2 sprays into both nostrils 4 (four) times daily. 15 mL Shirlee Latch, PA-C      PDMP not reviewed this  encounter.   Shirlee Latch, PA-C 01/03/23 1010

## 2023-01-03 NOTE — ED Triage Notes (Signed)
Patient c/o sore throat for the past 3 weeks.  Patient denies fevers.  Patient had tonsillectomy in December.  Patient also reports dizziness for the past 2 days.

## 2023-01-03 NOTE — Discharge Instructions (Addendum)
-  Strep is negative. Sore throat likely due to post nasal drainage. I sent a different nasal spray.  -Dizziness and passing out can be related to eustachian tube dysfunction and congestion. I sent meclizine to pharmacy. -Increase rest and fluids. Careful with position changes.  -F/u with PCP if no improvement in symptoms over the next 1 week. -Go to ED if you pass out again or if you have severe headaches, weakness, chest pain, shortness of breath, racing heart.

## 2023-05-28 ENCOUNTER — Ambulatory Visit: Payer: 59

## 2023-05-28 ENCOUNTER — Ambulatory Visit
Admission: RE | Admit: 2023-05-28 | Discharge: 2023-05-28 | Disposition: A | Discharge status: Home / Self Care | Payer: 59 | Source: Ambulatory Visit | Attending: Family Medicine | Admitting: Family Medicine

## 2023-05-28 VITALS — BP 112/81 | HR 85 | Temp 99.0°F | Resp 16 | Ht 65.0 in | Wt 190.0 lb

## 2023-05-28 DIAGNOSIS — B9789 Other viral agents as the cause of diseases classified elsewhere: Secondary | ICD-10-CM | POA: Diagnosis not present

## 2023-05-28 DIAGNOSIS — R051 Acute cough: Secondary | ICD-10-CM | POA: Diagnosis not present

## 2023-05-28 DIAGNOSIS — R059 Cough, unspecified: Secondary | ICD-10-CM

## 2023-05-28 DIAGNOSIS — J988 Other specified respiratory disorders: Secondary | ICD-10-CM

## 2023-05-28 LAB — GROUP A STREP BY PCR: Group A Strep by PCR: NOT DETECTED

## 2023-05-28 LAB — SARS CORONAVIRUS 2 BY RT PCR: SARS Coronavirus 2 by RT PCR: NEGATIVE

## 2023-05-28 MED ORDER — HYDROCOD POLI-CHLORPHE POLI ER 10-8 MG/5ML PO SUER: 5.0000 mL | Freq: Two times a day (BID) | ORAL | 0 refills | Status: DC | PRN

## 2023-05-28 NOTE — Discharge Instructions (Addendum)
Your COVID and strep test were both negative.  Your x-ray did not show any pneumonia, on my review of your xray images however the radiologist has not yet read your xray. If it is significantly abnormal or urgent, someone will contact you.  You should see your results in MyChart.   If your were prescribed medication, stop by the pharmacy to pick up your cough medicine.  This may make you sleepy so do not drive or operate heavy machinery while taking this medication.   You can take Tylenol and/or Ibuprofen as needed for fever reduction and pain relief.    For cough: honey 1/2 to 1 teaspoon (you can dilute the honey in water or another fluid).  You can also use guaifenesin and dextromethorphan for cough. You can use a humidifier for chest congestion and cough.  If you don't have a humidifier, you can sit in the bathroom with the hot shower running.      For sore throat: try warm salt water gargles, Mucinex sore throat cough drops or cepacol lozenges, throat spray, warm tea or water with lemon/honey, popsicles or ice, or OTC cold relief medicine for throat discomfort. You can also purchase chloraseptic spray at the pharmacy or dollar store.   For congestion: take a daily anti-histamine like Zyrtec, Claritin, and a oral decongestant, such as pseudoephedrine.  You can also use Flonase 1-2 sprays in each nostril daily. Afrin is also a good option, if you do not have high blood pressure.    It is important to stay hydrated: drink plenty of fluids (water, gatorade/powerade/pedialyte, juices, or teas) to keep your throat moisturized and help further relieve irritation/discomfort.    Return or go to the Emergency Department if symptoms worsen or do not improve in the next few days

## 2023-05-28 NOTE — ED Provider Notes (Signed)
[facility]    CSN: [id number] Arrival date & time: 05/28/23  1628      History   Chief Complaint Chief Complaint  Patient presents with   Cough    Appt   Sore Throat    HPI Mallory Clayton is a 42 y.o. female.   HPI  History obtained from the patient. Mallory Clayton presents for cough that started last Friday with new sore throat and ear pain that started yesterday.  She is concerned that the cough is starting to settle into her chest and she is coughing up some mucus.  Has been taking Mucinex without relief.  No known sick contacts. No history of asthma or smoking.  Denies nausea, vomiting, diarrhea, body aches, headache, belly pain.  Her coughing is starting to bother her sleep.       Past Medical History:  Diagnosis Date   Syncope    suspect vesovagal. normal ECG and QTc. echo 10/11: EF 55-60%, normal diastolic dysfunction, normal wall motion, no significant valvular abnm., no pulmonary htn, normal appearing RV. holter 10/11; occassional eptopic atrial rhythm with rate in 50s, no worrisome events, average HR 83.    Patient Active Problem List   Diagnosis Date Noted   Delivery by cesarean section using transverse incision of lower segment of uterus 01/05/2018   History of macrosomia in infant in prior pregnancy, currently pregnant 10/28/2017   Advanced maternal age in multigravida, second trimester 07/22/2017   Nausea/vomiting in pregnancy 05/23/2017   History of gestational diabetes 05/23/2017   Positive urine pregnancy test 05/23/2017   Fetal macrosomia in first trimester 05/23/2017   History of postpartum hemorrhage 05/23/2017   History of cesarean section 05/23/2017   Irregular periods 05/23/2017   Amenorrhea 05/23/2017   Increased body mass index 05/09/2015   SYNCOPE 06/18/2010    Past Surgical History:  Procedure Laterality Date   CESAREAN SECTION  08/26/2012   CESAREAN SECTION N/A 01/05/2018   Procedure: REPEAT CESAREAN SECTION WITH BILATERAL  TUBALIGATION;  Surgeon: Linzie Collin, MD;  Location: [facility] ORS;  Service: Obstetrics;  Laterality: N/A;   TONSILLECTOMY     TUBAL LIGATION     WISDOM TOOTH EXTRACTION      OB History     Gravida  5   Para  4   Term  4   Preterm      AB  1   Living  4      SAB  1   IAB      Ectopic      Multiple  0   Live Births  4            Home Medications    Prior to Admission medications   Medication Sig Start Date End Date Taking? Authorizing Provider  chlorpheniramine-HYDROcodone (TUSSIONEX) 10-8 MG/5ML Take 5 mLs by mouth every 12 (twelve) hours as needed for cough. 05/28/23  Yes Katha Cabal, DO    Family History Family History  Problem Relation Age of Onset   Diabetes Mother        side of her family   Heart disease Other        GF - heart trouble    Colon cancer Maternal Aunt    Breast cancer Maternal Aunt    Colon cancer Maternal Uncle    Coronary artery disease Neg Hx        congenital heart disease, sudden death    Ovarian cancer Neg Hx     Social History Social  History   Tobacco Use   Smoking status: Never   Smokeless tobacco: Never   Tobacco comments:    non smoker  Vaping Use   Vaping status: Never Used  Substance Use Topics   Alcohol use: No   Drug use: No     Allergies   Patient has no known allergies.   Review of Systems Review of Systems: negative unless otherwise stated in HPI.      Physical Exam Triage Vital Signs ED Triage Vitals  Encounter Vitals Group     BP 05/28/23 1642 112/81     Systolic BP Percentile --      Diastolic BP Percentile --      Pulse Rate 05/28/23 1642 85     Resp 05/28/23 1642 16     Temp 05/28/23 1642 99 F (37.2 C)     Temp Source 05/28/23 1642 Oral     SpO2 05/28/23 1642 100 %     Weight 05/28/23 1642 190 lb (86.2 kg)     Height 05/28/23 1642 5\' 5"  (1.651 m)     Head Circumference --      Peak Flow --      Pain Score 05/28/23 1645 7     Pain Loc --      Pain Education --       Exclude from Growth Chart --    No data found.  Updated Vital Signs BP 112/81 (BP Location: Left Arm)   Pulse 85   Temp 99 F (37.2 C) (Oral)   Resp 16   Ht 5\' 5"  (1.651 m)   Wt 86.2 kg   SpO2 100%   BMI 31.62 kg/m   Visual Acuity Right Eye Distance:   Left Eye Distance:   Bilateral Distance:    Right Eye Near:   Left Eye Near:    Bilateral Near:     Physical Exam GEN:     alert, ill but non-toxic appearing female in no distress    HENT:  mucus membranes moist, oropharyngeal without lesions, mild erythema, no tonsillar hypertrophy or exudates, no nasal discharge, right TM effusion without erythema, left TM normal, no bulging bilaterally EYES:   pupils equal and reactive, no scleral injection or discharge NECK:  normal ROM,no meningismus   RESP:  no increased work of breathing, clear to auscultation bilaterally CVS:   regular rate and rhythm Skin:   warm and dry, no rash on visible skin    UC Treatments / Results  Labs (all labs ordered are listed, but only abnormal results are displayed) Labs Reviewed  GROUP A STREP BY PCR  SARS CORONAVIRUS 2 BY RT PCR    EKG   Radiology DG Chest 2 View  Result Date: 05/28/2023 CLINICAL DATA:  Cough, shortness of breath EXAM: CHEST - 2 VIEW COMPARISON:  None Available. FINDINGS: The heart size and mediastinal contours are within normal limits. Both lungs are clear. The visualized skeletal structures are unremarkable. IMPRESSION: Normal study. Electronically Signed   By: Charlett Nose M.D.   On: 05/28/2023 18:11     Procedures Procedures (including critical care time)  Medications Ordered in UC Medications - No data to display  Initial Impression / Assessment and Plan / UC Course  I have reviewed the triage vital signs and the nursing notes.  Pertinent labs & imaging results that were available during my care of the patient were reviewed by me and considered in my medical decision making (see chart for details).  Pt is a 42 y.o. female who presents for 3 days of respiratory symptoms. Mallory Clayton is afebrile here without recent antipyretics. Satting well on room air. Overall pt is non-toxic appearing, well hydrated, without respiratory distress. Pulmonary exam is unremarkable.  Strep and COVID testing obtained and were negative.  Patient concerned the cough is starting to settle into her chest.  Chest x-ray obtained and personally reviewed by me showing no evidence of pneumonia, pleural effusion, new or Flomax, rib fractures or cardiomegaly.Patient aware the radiologist has not read her xray and is comfortable with the preliminary read by me. Will review radiologist read when available and call patient if a change in plan is warranted.  Pt agreeable to this plan prior to discharge.   History consistent with viral respiratory illness. Discussed symptomatic treatment.  Explained lack of efficacy of antibiotics in viral disease.  Typical duration of symptoms discussed.  Tussionex for cough to allow patient to rest.  Declined lidocaine solution for her throat.  Return and ED precautions given and voiced understanding. Discussed MDM, treatment plan and plan for follow-up with patient who agrees with plan.   Radiologist impression reviewed.    Final Clinical Impressions(s) / UC Diagnoses   Final diagnoses:  Viral respiratory illness  Acute cough     Discharge Instructions      Your COVID and strep test were both negative.  Your x-ray did not show any pneumonia, on my review of your xray images however the radiologist has not yet read your xray. If it is significantly abnormal or urgent, someone will contact you.  You should see your results in MyChart.   If your were prescribed medication, stop by the pharmacy to pick up your cough medicine.  This may make you sleepy so do not drive or operate heavy machinery while taking this medication.   You can take Tylenol and/or Ibuprofen as needed for fever  reduction and pain relief.    For cough: honey 1/2 to 1 teaspoon (you can dilute the honey in water or another fluid).  You can also use guaifenesin and dextromethorphan for cough. You can use a humidifier for chest congestion and cough.  If you don't have a humidifier, you can sit in the bathroom with the hot shower running.      For sore throat: try warm salt water gargles, Mucinex sore throat cough drops or cepacol lozenges, throat spray, warm tea or water with lemon/honey, popsicles or ice, or OTC cold relief medicine for throat discomfort. You can also purchase chloraseptic spray at the pharmacy or dollar store.   For congestion: take a daily anti-histamine like Zyrtec, Claritin, and a oral decongestant, such as pseudoephedrine.  You can also use Flonase 1-2 sprays in each nostril daily. Afrin is also a good option, if you do not have high blood pressure.    It is important to stay hydrated: drink plenty of fluids (water, gatorade/powerade/pedialyte, juices, or teas) to keep your throat moisturized and help further relieve irritation/discomfort.    Return or go to the Emergency Department if symptoms worsen or do not improve in the next few days        ED Prescriptions     Medication Sig Dispense Auth. Provider   chlorpheniramine-HYDROcodone (TUSSIONEX) 10-8 MG/5ML Take 5 mLs by mouth every 12 (twelve) hours as needed for cough. 115 mL Jamar Weatherall, Seward Meth, DO      I have reviewed the PDMP during this encounter.   Katha Cabal, DO 05/31/23 1342

## 2023-05-28 NOTE — ED Triage Notes (Signed)
Pt c/o cough & sore throat x3 days. Denies any fevers. Has tried mucinex w/o relief.

## 2023-06-01 ENCOUNTER — Encounter: Payer: Self-pay | Admitting: Emergency Medicine

## 2023-06-01 ENCOUNTER — Ambulatory Visit
Admission: EM | Admit: 2023-06-01 | Discharge: 2023-06-01 | Disposition: A | Discharge status: Home / Self Care | Payer: 59 | Attending: Family Medicine | Admitting: Family Medicine

## 2023-06-01 ENCOUNTER — Ambulatory Visit (INDEPENDENT_AMBULATORY_CARE_PROVIDER_SITE_OTHER): Payer: 59

## 2023-06-01 DIAGNOSIS — R059 Cough, unspecified: Secondary | ICD-10-CM | POA: Diagnosis not present

## 2023-06-01 DIAGNOSIS — R509 Fever, unspecified: Secondary | ICD-10-CM

## 2023-06-01 DIAGNOSIS — J189 Pneumonia, unspecified organism: Secondary | ICD-10-CM

## 2023-06-01 MED ORDER — AZITHROMYCIN 250 MG PO TABS: ORAL_TABLET | ORAL | 0 refills | Status: DC

## 2023-06-01 MED ORDER — BENZONATATE 100 MG PO CAPS: 100.0000 mg | ORAL_CAPSULE | Freq: Three times a day (TID) | ORAL | 0 refills | Status: DC

## 2023-06-01 MED ORDER — AMOXICILLIN-POT CLAVULANATE 875-125 MG PO TABS: 1.0000 | ORAL_TABLET | Freq: Two times a day (BID) | ORAL | 0 refills | Status: AC

## 2023-06-01 NOTE — ED Triage Notes (Signed)
Patient c/o cough and chest congestion and fever that has worsen since she was here on Wed.  Patient states that she was COVID and strep negative.  Patient states that she had a chest x-ray on Wed.

## 2023-06-01 NOTE — ED Provider Notes (Signed)
[facility]    CSN: [id number] Arrival date & time: 06/01/23  1356      History   Chief Complaint No chief complaint on file.   HPI Mallory Clayton is a 42 y.o. female.   HPI  History obtained from the patient. Mallory Clayton presents for cough for over a week and new onset fever on Friday.  She is able to rest with her prescription cough syrup but not taking it during the day as it makes her too sleepy.  Cough has not improved.  Feels like she has been wheezing since her last visit on 05/28/2023.  No history of asthma.  No known sick contacts.        Past Medical History:  Diagnosis Date   Syncope    suspect vesovagal. normal ECG and QTc. echo 10/11: EF 55-60%, normal diastolic dysfunction, normal wall motion, no significant valvular abnm., no pulmonary htn, normal appearing RV. holter 10/11; occassional eptopic atrial rhythm with rate in 50s, no worrisome events, average HR 83.    Patient Active Problem List   Diagnosis Date Noted   Delivery by cesarean section using transverse incision of lower segment of uterus 01/05/2018   History of macrosomia in infant in prior pregnancy, currently pregnant 10/28/2017   Advanced maternal age in multigravida, second trimester 07/22/2017   Nausea/vomiting in pregnancy 05/23/2017   History of gestational diabetes 05/23/2017   Positive urine pregnancy test 05/23/2017   Fetal macrosomia in first trimester 05/23/2017   History of postpartum hemorrhage 05/23/2017   History of cesarean section 05/23/2017   Irregular periods 05/23/2017   Amenorrhea 05/23/2017   Increased body mass index 05/09/2015   SYNCOPE 06/18/2010    Past Surgical History:  Procedure Laterality Date   CESAREAN SECTION  08/26/2012   CESAREAN SECTION N/A 01/05/2018   Procedure: REPEAT CESAREAN SECTION WITH BILATERAL TUBALIGATION;  Surgeon: Linzie Collin, MD;  Location: [facility] ORS;  Service: Obstetrics;  Laterality: N/A;   TONSILLECTOMY     TUBAL  LIGATION     WISDOM TOOTH EXTRACTION      OB History     Gravida  5   Para  4   Term  4   Preterm      AB  1   Living  4      SAB  1   IAB      Ectopic      Multiple  0   Live Births  4            Home Medications    Prior to Admission medications   Medication Sig Start Date End Date Taking? Authorizing Provider  chlorpheniramine-HYDROcodone (TUSSIONEX) 10-8 MG/5ML Take 5 mLs by mouth every 12 (twelve) hours as needed for cough. 05/28/23   Katha Cabal, DO    Family History Family History  Problem Relation Age of Onset   Diabetes Mother        side of her family   Heart disease Other        GF - heart trouble    Colon cancer Maternal Aunt    Breast cancer Maternal Aunt    Colon cancer Maternal Uncle    Coronary artery disease Neg Hx        congenital heart disease, sudden death    Ovarian cancer Neg Hx     Social History Social History   Tobacco Use   Smoking status: Never   Smokeless tobacco: Never   Tobacco comments:  non smoker  Vaping Use   Vaping status: Never Used  Substance Use Topics   Alcohol use: No   Drug use: No     Allergies   Patient has no known allergies.   Review of Systems Review of Systems: negative unless otherwise stated in HPI.      Physical Exam Triage Vital Signs ED Triage Vitals  Encounter Vitals Group     BP      Systolic BP Percentile      Diastolic BP Percentile      Pulse      Resp      Temp      Temp src      SpO2      Weight      Height      Head Circumference      Peak Flow      Pain Score      Pain Loc      Pain Education      Exclude from Growth Chart    No data found.  Updated Vital Signs There were no vitals taken for this visit.  Visual Acuity Right Eye Distance:   Left Eye Distance:   Bilateral Distance:    Right Eye Near:   Left Eye Near:    Bilateral Near:     Physical Exam GEN: pleasant well appearing female, in no acute distress  CV: regular rate and  rhythm RESP: no increased work of breathing, clear to ascultation bilaterally, no wheezing, rales or rhonchi SKIN: warm, dry, no rash on visible skin     UC Treatments / Results  Labs (all labs ordered are listed, but only abnormal results are displayed) Labs Reviewed - No data to display  EKG   Radiology No results found.  Procedures Procedures (including critical care time)  Medications Ordered in UC Medications - No data to display  Initial Impression / Assessment and Plan / UC Course  I have reviewed the triage vital signs and the nursing notes.  Pertinent labs & imaging results that were available during my care of the patient were reviewed by me and considered in my medical decision making (see chart for details).       Pt is a 42 y.o. female who presents for cough for 1 to 2 weeks with new onset fever.  Seen 05/28/2019 for and had negative COVID test andno  pneumonia on her chest x-ray. Mallory Clayton has an elevated temperature here of 90 9.23F and is satting 94% on room air.  For comparison she was 100% on room air on 05/28/2023.   Overall pt is ill but non-toxic appearing, well hydrated, without respiratory distress. Pulmonary exam is unremarkable.  Given decreased oxygen saturation and new fever will obtain repeat chest x-ray.   Chest xray personally reviewed by me without ***focal pneumonia, pleural effusion, cardiomegaly or pneumothorax.    Return and ED precautions given and voiced understanding. Discussed MDM, treatment plan and plan for follow-up with patient who agrees with plan.     Final Clinical Impressions(s) / UC Diagnoses   Final diagnoses:  None   Discharge Instructions   None    ED Prescriptions   None    PDMP not reviewed this encounter.

## 2023-06-01 NOTE — Discharge Instructions (Signed)
Your x-ray looks like you have a pneumonia in the right lower lobe.  Stop at the pharmacy to pick up both of your antibiotics and your Tessalon Perles capsules for cough.   Go to ED for red flag symptoms, including; fevers you cannot reduce with Tylenol/Motrin, severe headaches, vision changes, numbness/weakness in part of the body, lethargy, confusion, intractable vomiting, severe dehydration, chest pain, breathing difficulty, severe persistent abdominal or pelvic pain, signs of severe infection (increased redness, swelling of an area), feeling faint or passing out, dizziness, etc. You should especially go to the ED for sudden acute worsening of condition if you do not elect to go at this time.

## 2023-06-06 ENCOUNTER — Ambulatory Visit: Payer: 59 | Admitting: Family Medicine

## 2023-06-27 ENCOUNTER — Ambulatory Visit (INDEPENDENT_AMBULATORY_CARE_PROVIDER_SITE_OTHER): Payer: 59 | Admitting: Family Medicine

## 2023-06-27 ENCOUNTER — Encounter: Payer: Self-pay | Admitting: Family Medicine

## 2023-06-27 ENCOUNTER — Ambulatory Visit (INDEPENDENT_AMBULATORY_CARE_PROVIDER_SITE_OTHER): Payer: 59

## 2023-06-27 VITALS — BP 110/64 | HR 87 | Temp 98.4°F | Resp 16 | Ht 65.0 in | Wt 194.2 lb

## 2023-06-27 DIAGNOSIS — Z6832 Body mass index (BMI) 32.0-32.9, adult: Secondary | ICD-10-CM

## 2023-06-27 DIAGNOSIS — E66811 Obesity, class 1: Secondary | ICD-10-CM | POA: Diagnosis not present

## 2023-06-27 DIAGNOSIS — Z803 Family history of malignant neoplasm of breast: Secondary | ICD-10-CM

## 2023-06-27 DIAGNOSIS — Z13220 Encounter for screening for lipoid disorders: Secondary | ICD-10-CM

## 2023-06-27 DIAGNOSIS — Z1159 Encounter for screening for other viral diseases: Secondary | ICD-10-CM | POA: Diagnosis not present

## 2023-06-27 DIAGNOSIS — Z8632 Personal history of gestational diabetes: Secondary | ICD-10-CM

## 2023-06-27 DIAGNOSIS — R7309 Other abnormal glucose: Secondary | ICD-10-CM

## 2023-06-27 DIAGNOSIS — E559 Vitamin D deficiency, unspecified: Secondary | ICD-10-CM | POA: Diagnosis not present

## 2023-06-27 DIAGNOSIS — E538 Deficiency of other specified B group vitamins: Secondary | ICD-10-CM | POA: Diagnosis not present

## 2023-06-27 DIAGNOSIS — J189 Pneumonia, unspecified organism: Secondary | ICD-10-CM

## 2023-06-27 DIAGNOSIS — N939 Abnormal uterine and vaginal bleeding, unspecified: Secondary | ICD-10-CM

## 2023-06-27 LAB — CBC WITH DIFFERENTIAL/PLATELET
Basophils Absolute: 0 10*3/uL (ref 0.0–0.1)
Basophils Relative: 0.8 % (ref 0.0–3.0)
Eosinophils Absolute: 0.2 10*3/uL (ref 0.0–0.7)
Eosinophils Relative: 2.7 % (ref 0.0–5.0)
HCT: 40.5 % (ref 36.0–46.0)
Hemoglobin: 13.3 g/dL (ref 12.0–15.0)
Lymphocytes Relative: 39.2 % (ref 12.0–46.0)
Lymphs Abs: 2.5 10*3/uL (ref 0.7–4.0)
MCHC: 32.8 g/dL (ref 30.0–36.0)
MCV: 93.5 fL (ref 78.0–100.0)
Monocytes Absolute: 0.4 10*3/uL (ref 0.1–1.0)
Monocytes Relative: 6.9 % (ref 3.0–12.0)
Neutro Abs: 3.2 10*3/uL (ref 1.4–7.7)
Neutrophils Relative %: 50.4 % (ref 43.0–77.0)
Platelets: 274 10*3/uL (ref 150.0–400.0)
RBC: 4.33 Mil/uL (ref 3.87–5.11)
RDW: 13.4 % (ref 11.5–15.5)
WBC: 6.3 10*3/uL (ref 4.0–10.5)

## 2023-06-27 LAB — COMPREHENSIVE METABOLIC PANEL
ALT: 23 U/L (ref 0–35)
AST: 25 U/L (ref 0–37)
Albumin: 4.1 g/dL (ref 3.5–5.2)
Alkaline Phosphatase: 48 U/L (ref 39–117)
BUN: 9 mg/dL (ref 6–23)
CO2: 29 meq/L (ref 19–32)
Calcium: 9.7 mg/dL (ref 8.4–10.5)
Chloride: 104 meq/L (ref 96–112)
Creatinine, Ser: 0.69 mg/dL (ref 0.40–1.20)
GFR: 107.34 mL/min (ref >60.00)
Glucose, Bld: 95 mg/dL (ref 70–99)
Potassium: 4.5 meq/L (ref 3.5–5.1)
Sodium: 139 meq/L (ref 135–145)
Total Bilirubin: 0.7 mg/dL (ref 0.2–1.2)
Total Protein: 6.8 g/dL (ref 6.0–8.3)

## 2023-06-27 LAB — LIPID PANEL
Cholesterol: 212 mg/dL — ABNORMAL HIGH (ref 0–200)
HDL: 72 mg/dL (ref >39.00)
LDL Cholesterol: 120 mg/dL — ABNORMAL HIGH (ref 0–99)
NonHDL: 139.87
Total CHOL/HDL Ratio: 3
Triglycerides: 97 mg/dL (ref 0.0–149.0)
VLDL: 19.4 mg/dL (ref 0.0–40.0)

## 2023-06-27 LAB — VITAMIN B12: Vitamin B-12: 432 pg/mL (ref 211–911)

## 2023-06-27 LAB — HEMOGLOBIN A1C: Hgb A1c MFr Bld: 5.3 % (ref 4.6–6.5)

## 2023-06-27 LAB — VITAMIN D 25 HYDROXY (VIT D DEFICIENCY, FRACTURES): VITD: 32.77 ng/mL (ref 30.00–100.00)

## 2023-06-27 LAB — TSH: TSH: 0.86 u[IU]/mL (ref 0.35–5.50)

## 2023-06-27 NOTE — Progress Notes (Unsigned)
   SUBJECTIVE:   Chief Complaint  Patient presents with  . Establish Care   HPI ***  PERTINENT PMH / PSH: ***  OBJECTIVE:  BP 110/64   Pulse 87   Temp 98.4 F (36.9 C)   Resp 16   Ht 5\' 5"  (1.651 m)   Wt 194 lb 4 oz (88.1 kg)   LMP 06/14/2023 (Approximate)   SpO2 97%   BMI 32.32 kg/m    Physical Exam     06/27/2023    9:12 AM  Depression screen PHQ 2/9  Decreased Interest 0  Down, Depressed, Hopeless 0  PHQ - 2 Score 0  Altered sleeping 0  Tired, decreased energy 0  Change in appetite 0  Feeling bad or failure about yourself  0  Trouble concentrating 0  Moving slowly or fidgety/restless 0  Suicidal thoughts 0  PHQ-9 Score 0  Difficult doing work/chores Not difficult at all      06/27/2023    9:13 AM  GAD 7 : Generalized Anxiety Score  Nervous, Anxious, on Edge 0  Control/stop worrying 0  Worry too much - different things 0  Trouble relaxing 0  Restless 0  Easily annoyed or irritable 0  Afraid - awful might happen 0  Total GAD 7 Score 0  Anxiety Difficulty Not difficult at all    ASSESSMENT/PLAN:  There are no diagnoses linked to this encounter. PDMP reviewed***  No follow-ups on file.  Dana Allan, MD

## 2023-06-27 NOTE — Patient Instructions (Addendum)
It was a pleasure meeting you today. Thank you for allowing me to take part in your health care.  Our goals for today as we discussed include:  We will get some labs today.  If they are abnormal or we need to do something about them, I will call you.  If they are normal, I will send you a message on MyChart (if it is active) or a letter in the mail.  If you don't hear from [place] in 2 weeks, please call the office at the number below.   Follow up chest xray ordered  Follow up with OB for PAP and Mammogram  This is a list of the screening recommended for you and due dates:  Health Maintenance  Topic Date Due   Hepatitis C Screening  Never done   COVID-19 Vaccine (1 - 2023-24 season) Never done   Flu Shot  11/24/2023*   Mammogram  10/25/2023   Pap with HPV screening  09/27/2026   DTaP/Tdap/Td vaccine (2 - Td or Tdap) 06/07/2027   HIV Screening  Completed   HPV Vaccine  Aged Out  *Topic was postponed. The date shown is not the original due date.    Follow up with PCP as needed   If you have any questions or concerns, please do not hesitate to call the office at [phone].  I look forward to our next visit and until then take care and stay safe.  Regards,   Dana Allan, MD   [facility]

## 2023-06-28 LAB — FSH/LH
FSH: 9.1 m[IU]/mL
LH: 11 m[IU]/mL

## 2023-06-28 LAB — HEPATITIS C ANTIBODY: Hepatitis C Ab: NONREACTIVE

## 2023-06-29 ENCOUNTER — Encounter: Payer: Self-pay | Admitting: Family Medicine

## 2023-06-29 DIAGNOSIS — E559 Vitamin D deficiency, unspecified: Secondary | ICD-10-CM | POA: Insufficient documentation

## 2023-06-29 DIAGNOSIS — N939 Abnormal uterine and vaginal bleeding, unspecified: Secondary | ICD-10-CM | POA: Insufficient documentation

## 2023-06-29 DIAGNOSIS — R7309 Other abnormal glucose: Secondary | ICD-10-CM | POA: Insufficient documentation

## 2023-06-29 DIAGNOSIS — Z1159 Encounter for screening for other viral diseases: Secondary | ICD-10-CM | POA: Insufficient documentation

## 2023-06-29 DIAGNOSIS — Z803 Family history of malignant neoplasm of breast: Secondary | ICD-10-CM | POA: Insufficient documentation

## 2023-06-29 DIAGNOSIS — E538 Deficiency of other specified B group vitamins: Secondary | ICD-10-CM | POA: Insufficient documentation

## 2023-06-29 DIAGNOSIS — E66811 Obesity, class 1: Secondary | ICD-10-CM | POA: Insufficient documentation

## 2023-06-29 DIAGNOSIS — Z13220 Encounter for screening for lipoid disorders: Secondary | ICD-10-CM | POA: Insufficient documentation

## 2023-06-29 DIAGNOSIS — J189 Pneumonia, unspecified organism: Secondary | ICD-10-CM | POA: Insufficient documentation

## 2023-06-29 NOTE — Assessment & Plan Note (Signed)
Monitor for development of DM Check A1c

## 2023-06-29 NOTE — Assessment & Plan Note (Signed)
History of irregular heavy periods, difficulty with weight, and facial hair growth. No current desire for pregnancy. -Order FSH, LH, and thyroid labs. -Discuss potential benefits of low-dose birth control for regulation of periods and hormone balance.

## 2023-06-29 NOTE — Assessment & Plan Note (Addendum)
Maternal aunt with history of breast cancer. Patient is up-to-date with mammograms and plans to continue annual screenings. -Continue annual mammograms and regular self-breast exams. -Follows with OBGYN

## 2023-06-29 NOTE — Assessment & Plan Note (Deleted)
History of irregular heavy periods, difficulty with weight, and facial hair growth. No current desire for pregnancy. -Order FSH, LH, and thyroid labs. -Discuss potential benefits of low-dose birth control for regulation of periods and hormone balance.

## 2023-06-29 NOTE — Assessment & Plan Note (Signed)
Resolving symptoms with occasional wheezing. No current shortness of breath or chest pain. Lungs clear on examination. -Check recent chest x-ray to assess for resolution or need for repeat imaging.

## 2024-04-29 NOTE — Progress Notes (Unsigned)
 GYNECOLOGY ANNUAL PHYSICAL EXAM PROGRESS NOTE  Subjective:    Mallory Clayton is a 43 y.o. [phone] female who presents for an annual exam.  The patient {is/is not/has never been:13135} sexually active. The patient participates in regular exercise: {yes/no/not asked:9010}. Has the patient ever been transfused or tattooed?: {yes/no/not asked:9010}. The patient reports that there {is/is not:9024} domestic violence in her life.   The patient has the following complaints today:   Menstrual History: Menarche age: *** No LMP recorded.     Gynecologic History:  Contraception: tubal ligation History of STI's:  Last Pap: 09/27/21. Results were: normal. Denies h/o abnormal pap smears. Last mammogram: Not age appropriate    OB History  Gravida Para Term Preterm AB Living  5 4 4  0 1 4  SAB IAB Ectopic Multiple Live Births  1 0 0 0 4    # Outcome Date GA Lbr Len/2nd Weight Sex Type Anes PTL Lv  5 Term 01/05/18 [redacted]w[redacted]d  9 lb 13 oz (4.45 kg) F CS-LTranv Spinal  LIV     Name: Petite,GIRL Emmalee     Apgar1: 9  Apgar5: 9  4 Term 2014   9 lb 1.6 oz (4.128 kg) M CS-LTranv   LIV  3 SAB 2013          2 Term 2010 [redacted]w[redacted]d  8 lb 1.6 oz (3.674 kg) M Vag-Spont   LIV  1 Term 2008   7 lb 11.2 oz (3.493 kg) M Vag-Spont   LIV    Past Medical History:  Diagnosis Date   Syncope    suspect vesovagal. normal ECG and QTc. echo 10/11: EF 55-60%, normal diastolic dysfunction, normal wall motion, no significant valvular abnm., no pulmonary htn, normal appearing RV. holter 10/11; occassional eptopic atrial rhythm with rate in 50s, no worrisome events, average HR 83.    Past Surgical History:  Procedure Laterality Date   CESAREAN SECTION  08/26/2012   CESAREAN SECTION N/A 01/05/2018   Procedure: REPEAT CESAREAN SECTION WITH BILATERAL TUBALIGATION;  Surgeon: Janit Alm Agent, MD;  Location: [facility] ORS;  Service: Obstetrics;  Laterality: N/A;   TONSILLECTOMY     TUBAL LIGATION     WISDOM TOOTH EXTRACTION       Family History  Problem Relation Age of Onset   Colon cancer Maternal Aunt    Breast cancer Maternal Aunt    Colon cancer Maternal Uncle    Heart disease Other        GF - heart trouble    Coronary artery disease Neg Hx        congenital heart disease, sudden death    Ovarian cancer Neg Hx     Social History   Socioeconomic History   Marital status: Married    Spouse name: Not on file   Number of children: Not on file   Years of education: Not on file   Highest education level: Not on file  Occupational History   Not on file  Tobacco Use   Smoking status: Never   Smokeless tobacco: Never   Tobacco comments:    non smoker  Vaping Use   Vaping status: Never Used  Substance and Sexual Activity   Alcohol use: No   Drug use: No   Sexual activity: Yes    Birth control/protection: None, Surgical    Comment: Tubal ligation  Other Topics Concern   Not on file  Social History Narrative   Married, full time Recruitment consultant. Does  not get regular exercise.    Social Drivers of Corporate investment banker Strain: Not on file  Food Insecurity: No Food Insecurity (05/29/2020)   Received from [facility]   Hunger Vital Sign    Worried About Running Out of Food in the Last Year: Never true    Ran Out of Food in the Last Year: Never true  Transportation Needs: No Transportation Needs (05/29/2020)   Received from [facility] - Transportation    Lack of Transportation (Medical): No    Lack of Transportation (Non-Medical): No  Physical Activity: Not on file  Stress: Not on file  Social Connections: Not on file  Intimate Partner Violence: Not on file    Current Outpatient Medications on File Prior to Visit  Medication Sig Dispense Refill   Multiple Vitamins-Minerals (WOMENS MULTI PO) Take 1 tablet by mouth daily.     No current facility-administered medications on file prior to visit.    No Known Allergies   Review of  Systems Constitutional: negative for chills, fatigue, fevers and sweats Eyes: negative for irritation, redness and visual disturbance Ears, nose, mouth, throat, and face: negative for hearing loss, nasal congestion, snoring and tinnitus Respiratory: negative for asthma, cough, sputum Cardiovascular: negative for chest pain, dyspnea, exertional chest pressure/discomfort, irregular heart beat, palpitations and syncope Gastrointestinal: negative for abdominal pain, change in bowel habits, nausea and vomiting Genitourinary: negative for abnormal menstrual periods, genital lesions, sexual problems and vaginal discharge, dysuria and urinary incontinence Integument/breast: negative for breast lump, breast tenderness and nipple discharge Hematologic/lymphatic: negative for bleeding and easy bruising Musculoskeletal:negative for back pain and muscle weakness Neurological: negative for dizziness, headaches, vertigo and weakness Endocrine: negative for diabetic symptoms including polydipsia, polyuria and skin dryness Allergic/Immunologic: negative for hay fever and urticaria      Objective:  There were no vitals taken for this visit. There is no height or weight on file to calculate BMI.    General Appearance:    Alert, cooperative, no distress, appears stated age  Head:    Normocephalic, without obvious abnormality, atraumatic  Eyes:    PERRL, conjunctiva/corneas clear, EOM's intact, both eyes  Ears:    Normal external ear canals, both ears  Nose:   Nares normal, septum midline, mucosa normal, no drainage or sinus tenderness  Throat:   Lips, mucosa, and tongue normal; teeth and gums normal  Neck:   Supple, symmetrical, trachea midline, no adenopathy; thyroid : no enlargement/tenderness/nodules; no carotid bruit or JVD  Back:     Symmetric, no curvature, ROM normal, no CVA tenderness  Lungs:     Clear to auscultation bilaterally, respirations unlabored  Chest Wall:    No tenderness or deformity    Heart:    Regular rate and rhythm, S1 and S2 normal, no murmur, rub or gallop  Breast Exam:    No tenderness, masses, or nipple abnormality  Abdomen:     Soft, non-tender, bowel sounds active all four quadrants, no masses, no organomegaly.    Genitalia:    Pelvic:external genitalia normal, vagina without lesions, discharge, or tenderness, rectovaginal septum  normal. Cervix normal in appearance, no cervical motion tenderness, no adnexal masses or tenderness.  Uterus normal size, shape, mobile, regular contours, nontender.  Rectal:    Normal external sphincter.  No hemorrhoids appreciated. Internal exam not done.   Extremities:   Extremities normal, atraumatic, no cyanosis or edema  Pulses:   2+ and symmetric all extremities  Skin:   Skin  color, texture, turgor normal, no rashes or lesions  Lymph nodes:   Cervical, supraclavicular, and axillary nodes normal  Neurologic:   CNII-XII intact, normal strength, sensation and reflexes throughout   .  Labs:  Lab Results  Component Value Date   WBC 6.3 06/27/2023   HGB 13.3 06/27/2023   HCT 40.5 06/27/2023   MCV 93.5 06/27/2023   PLT 274.0 06/27/2023    Lab Results  Component Value Date   CREATININE 0.69 06/27/2023   BUN 9 06/27/2023   NA 139 06/27/2023   K 4.5 06/27/2023   CL 104 06/27/2023   CO2 29 06/27/2023    Lab Results  Component Value Date   ALT 23 06/27/2023   AST 25 06/27/2023   ALKPHOS 48 06/27/2023   BILITOT 0.7 06/27/2023    Lab Results  Component Value Date   TSH 0.86 06/27/2023     Assessment:   1. Encounter for well woman exam with routine gynecological exam   2. Encounter for screening mammogram for malignant neoplasm of breast      Plan:  Blood tests: {blood tests:13147}. Breast self exam technique reviewed and patient encouraged to perform self-exam monthly. Contraception: tubal ligation. Discussed healthy lifestyle modifications. Mammogram ordered Pap smear UTD. Flu vaccine: Follow up in 1 year  for annual exam   Damien Parsley, CNM [facility]

## 2024-04-29 NOTE — Patient Instructions (Signed)
 Preventive Care 61-43 Years Old, Female  Preventive care refers to lifestyle choices and visits with your health care provider that can promote health and wellness. Preventive care visits are also called wellness exams. What can I expect for my preventive care visit? Counseling During your preventive care visit, your health care provider may ask about your: Medical history, including: Past medical problems. Family medical history. Pregnancy history. Current health, including: Menstrual cycle. Method of birth control. Emotional well-being. Home life and relationship well-being. Sexual activity and sexual health. Lifestyle, including: Alcohol, nicotine  or tobacco, and drug use. Access to firearms. Diet, exercise, and sleep habits. Work and work Astronomer. Sunscreen use. Safety issues such as seatbelt and bike helmet use. Physical exam Your health care provider may check your: Height and weight. These may be used to calculate your BMI (body mass index). BMI is a measurement that tells if you are at a healthy weight. Waist circumference. This measures the distance around your waistline. This measurement also tells if you are at a healthy weight and may help predict your risk of certain diseases, such as type 2 diabetes and high blood pressure. Heart rate and blood pressure. Body temperature. Skin for abnormal spots. What immunizations do I need?  Vaccines are usually given at various ages, according to a schedule. Your health care provider will recommend vaccines for you based on your age, medical history, and lifestyle or other factors, such as travel or where you work. What tests do I need? Screening Your health care provider may recommend screening tests for certain conditions. This may include: Pelvic exam and Pap test. Lipid and cholesterol levels. Diabetes screening. This is done by checking your blood sugar (glucose) after you have not eaten for a while  (fasting). Hepatitis B test. Hepatitis C test. HIV (human immunodeficiency virus) test. STI (sexually transmitted infection) testing, if you are at risk. BRCA-related cancer screening. This may be done if you have a family history of breast, ovarian, tubal, or peritoneal cancers. Talk with your health care provider about your test results, treatment options, and if necessary, the need for more tests. Follow these instructions at home: Eating and drinking  Eat a healthy diet that includes fresh fruits and vegetables, whole grains, lean protein, and low-fat dairy products. Take vitamin and mineral supplements as recommended by your health care provider. Do not drink alcohol if: Your health care provider tells you not to drink. You are pregnant, may be pregnant, or are planning to become pregnant. If you drink alcohol: Limit how much you have to 0-1 drink a day. Know how much alcohol is in your drink. In the [place] one drink equals one 12 oz bottle of beer (355 mL), one 5 oz glass of wine (148 mL), or one 1 oz glass of hard liquor (44 mL). Lifestyle Brush your teeth every morning and night with fluoride toothpaste. Floss one time each day. Exercise for at least 30 minutes 5 or more days each week. Do not use any products that contain nicotine  or tobacco. These products include cigarettes, chewing tobacco, and vaping devices, such as e-cigarettes. If you need help quitting, ask your health care provider. Do not use drugs. If you are sexually active, practice safe sex. Use a condom or other form of protection to prevent STIs. If you do not wish to become pregnant, use a form of birth control. If you plan to become pregnant, see your health care provider for a prepregnancy visit. Find healthy ways to manage stress, such as:  Meditation, yoga, or listening to music. Journaling. Talking to a trusted person. Spending time with friends and family. Minimize exposure to UV radiation to reduce your  risk of skin cancer. Safety Always wear your seat belt while driving or riding in a vehicle. Do not drive: If you have been drinking alcohol. Do not ride with someone who has been drinking. If you have been using any mind-altering substances or drugs. While texting. When you are tired or distracted. Wear a helmet and other protective equipment during sports activities. If you have firearms in your house, make sure you follow all gun safety procedures. Seek help if you have been physically or sexually abused. What's next? Go to your health care provider once a year for an annual wellness visit. Ask your health care provider how often you should have your eyes and teeth checked. Stay up to date on all vaccines. This information is not intended to replace advice given to you by your health care provider. Make sure you discuss any questions you have with your health care provider. Document Revised: 02/07/2021 Document Reviewed: 02/07/2021 Elsevier Patient Education  2024 Elsevier Inc.  How to do a breast self-exam Doing breast self-exams can help you stay healthy. They're one way to know what's normal for your breasts. They can help you catch a problem while it's still small and can be treated. You need to: Check your breasts often. Tell your doctor about any changes. You should do breast self-exams even if you have breast implants. What you need: A mirror. A well-lit room. A pillow or other soft object. Look for changes  Take off all the clothes above your waist. Stand in front of a mirror in a room with good lighting. Put your hands down at your sides. Compare your breasts in the mirror. Look for difference between them, such as: Differences in shape. Differences in size. Wrinkles, dips, and bumps in one breast and not the other. Look at each breast for skin changes, such as: Redness. Scaly spots. Spots where your skin is thicker. Dimpling. Open sores. Look for changes in  your nipples, such as: Fluid coming out of a nipple. Fluid around a nipple. Bleeding. Dimpling. Redness. A nipple that looks pushed in or that has changed position. Feel for changes Lie on your back. Feel each breast. To do this: Pick a breast to feel. Place a pillow under the shoulder closest to that breast. Put the arm closest to that breast behind your head. Feel the breast using the hand of your other arm. Use the pads of your three middle fingers to make small circles starting near the nipple. Use light, medium, and firm pressure. Keep making circles, moving down over the breast. Stop when you feel your ribs. Start making circles with your fingers again, this time going up until you reach your collarbone. Then, make circles out across your breast and into your armpit area. Squeeze your nipple. Check for fluid and lumps. Do these steps again to check your other breast. Sit or stand in the tub or shower. With soapy water on your skin, feel each breast the same way you did when you were lying down. Write down what you find Writing down what you find can help you keep track of what you want to tell your doctor. Write down: What's normal for each breast. Any changes you find. Write down: The kind of change. If your breast feels tender or painful. Any lump you find. Write down its size and  where it is. When you last had your period. General tips If you're breastfeeding, the best time to check your breasts is after you feed your baby or after you use a breast pump. If you get a period, the best time to check your breasts is 5-7 days after your period ends. With time, you'll get more used to doing the self-exam. You'll also start to know if there are changes in your breasts. Contact a doctor if: You see a change in the shape or size of your breasts or nipples. You see a change in the skin of your breast or nipples. You have fluid coming from your nipples that isn't normal. You find  a new lump or thick area. You have breast pain. You have any concerns about your breast health. This information is not intended to replace advice given to you by your health care provider. Make sure you discuss any questions you have with your health care provider. Document Revised: 10/22/2023 Document Reviewed: 10/22/2023 Elsevier Patient Education  2025 [employer].

## 2024-04-30 ENCOUNTER — Encounter: Payer: Self-pay | Admitting: Certified Nurse Midwife

## 2024-04-30 ENCOUNTER — Ambulatory Visit (INDEPENDENT_AMBULATORY_CARE_PROVIDER_SITE_OTHER): Admitting: Certified Nurse Midwife

## 2024-04-30 VITALS — BP 123/83 | HR 84 | Resp 16 | Ht 65.0 in | Wt 204.7 lb

## 2024-04-30 DIAGNOSIS — Z01419 Encounter for gynecological examination (general) (routine) without abnormal findings: Secondary | ICD-10-CM | POA: Diagnosis not present

## 2024-04-30 DIAGNOSIS — E66811 Obesity, class 1: Secondary | ICD-10-CM

## 2024-04-30 DIAGNOSIS — Z6834 Body mass index (BMI) 34.0-34.9, adult: Secondary | ICD-10-CM | POA: Diagnosis not present

## 2024-04-30 DIAGNOSIS — E559 Vitamin D deficiency, unspecified: Secondary | ICD-10-CM

## 2024-04-30 DIAGNOSIS — Z131 Encounter for screening for diabetes mellitus: Secondary | ICD-10-CM

## 2024-04-30 DIAGNOSIS — Z13220 Encounter for screening for lipoid disorders: Secondary | ICD-10-CM

## 2024-04-30 DIAGNOSIS — Z1231 Encounter for screening mammogram for malignant neoplasm of breast: Secondary | ICD-10-CM

## 2024-08-11 ENCOUNTER — Encounter

## 2024-09-03 ENCOUNTER — Ambulatory Visit (INDEPENDENT_AMBULATORY_CARE_PROVIDER_SITE_OTHER)

## 2024-09-03 VITALS — BP 130/80 | HR 78 | Ht 65.0 in | Wt 213.8 lb

## 2024-09-03 DIAGNOSIS — E559 Vitamin D deficiency, unspecified: Secondary | ICD-10-CM | POA: Diagnosis not present

## 2024-09-03 DIAGNOSIS — R0609 Other forms of dyspnea: Secondary | ICD-10-CM | POA: Insufficient documentation

## 2024-09-03 DIAGNOSIS — N939 Abnormal uterine and vaginal bleeding, unspecified: Secondary | ICD-10-CM | POA: Diagnosis not present

## 2024-09-03 DIAGNOSIS — R609 Edema, unspecified: Secondary | ICD-10-CM | POA: Diagnosis not present

## 2024-09-03 DIAGNOSIS — E538 Deficiency of other specified B group vitamins: Secondary | ICD-10-CM | POA: Diagnosis not present

## 2024-09-03 DIAGNOSIS — R55 Syncope and collapse: Secondary | ICD-10-CM

## 2024-09-03 NOTE — Progress Notes (Signed)
 ++   New Patient Visit   Physician: Yunis Voorheis A Nishtha Raider, MD  Patient: Mallory Clayton   DOB: 1981-08-10   44 y.o. Female  MRN: [id number] Visit Date: 09/03/2024   Chief Complaint  Patient presents with   Establish Care   Subjective  Mallory Clayton is a 44 y.o. female who presents today as a new patient to establish care.   HPI  Discussed the use of AI scribe software for clinical note transcription with the patient, who gave verbal consent to proceed.  History of Present Illness   Mallory Clayton is a 44 year old female who presents with edema and shortness of breath on exertion.  Peripheral edema - Intermittent bilateral leg edema since spring of last year - Edema worsens with hot weather or prolonged sitting or standing - Significant enough to leave indentation when pressed - No clear pattern of worsening or improvement  Exertional dyspnea - Shortness of breath with exertion, including climbing stairs and hiking - Requires stopping to catch breath during activities - Notably struggled with elevation changes during a mission trip to [place] in April of last year  Syncope and orthostatic symptoms - History of vasovagal syncope since middle school - Past cardiac evaluations (echocardiogram, Holter monitor) showed normal cardiac function and some ectopic beats - complete 2016 - Hospitalized twice in 2023 for significant drops in blood pressure and high heart rate associated with syncope - Episodes often triggered by gastrointestinal upset or physical injury (e.g., ankle sprain)  Blood pressure findings - Blood pressure typically low, ranging from 110/64 to 123/80 - Blood pressure measured at 130/80 during today's visit, considered high for her  Dietary history and nutritional deficiencies - Diet includes fish and chicken; previously vegetarian - History of low B6 and B12 levels during period of vegetarian diet         ASSESSMENT & PLAN  Encounter Diagnoses   Name Primary?   Edema, unspecified type Yes   Exertional dyspnea    SYNCOPE    Vitamin D  deficiency    Vitamin B 12 deficiency     Orders Placed This Encounter  Procedures   CBC with Differential/Platelet   Comprehensive metabolic panel with GFR   Hemoglobin A1c   Lipid panel   Urinalysis, Routine w reflex microscopic   C-reactive protein   Hemoglobin A1c   Iron, TIBC and Ferritin Panel   TSH + free T4   Brain natriuretic peptide   B12 and Folate Panel    Assessment and Plan      Exertional dyspnea and lower extremity edema, evaluation for cardiopulmonary etiology Intermittent exertional dyspnea and pitting edema with exertion. Differential includes cardiopulmonary causes, anemia, or primary pulmonary issues. Previous cardiac evaluations normal, further evaluation needed due to new symptoms. - Ordered baseline labs: iron studies, cardiac markers, thyroid  function, blood counts, kidney function., iron, BNP - Scheduled follow-up in two weeks to review labs and determine further diagnostics. - Consider cardiac function evaluation if labs inconclusive.  Vasovagal syncope   Vasovagal syncope with recent hypotension and tachycardia. Possible POTS due to blood pressure variability and syncope triggers?  Previous cardiac evaluations normal. - Monitor for syncope or palpitations, report if occur. - Consider further cardiac evaluation if symptoms persist or worsen.  - Secondary autonomic cause possible  - She may need repeat ECG next visit depending on findings  General health maintenance Due for Pap smear - sees gynecology No recent flu or COVID vaccination. Current diet includes fish and chicken,  no smoking, alcohol, or drug use. Regular menstrual cycles.         Objective  BP 130/80 (BP Location: Left Arm, Patient Position: Sitting, Cuff Size: Large)   Pulse 78   Ht 5' 5 (1.651 m)   Wt 213 lb 12.8 oz (97 kg)   LMP 08/09/2024 (Approximate)   SpO2 99%   BMI 35.58 kg/m       Review of Systems  Constitutional:  Negative for chills, fever and weight loss.  Eyes:  Negative for blurred vision. h Respiratory:  Negative for cough and shortness of breath.   Cardiovascular:  Negative for chest pain and palpitations.  Trace edema bilat Skin:  Negative for rash.  Psychiatric/Behavioral:  Negative for depression. The patient is not nervous/anxious.      Physical Exam Physical Exam Vitals reviewed.  Constitutional:      Appearance: Normal appearance. Well-developed with normal weight.  HENT:     Head: Normocephalic and atraumatic.  Normal mucous membranes, no oral lesions Eyes:     Pupils: Pupils are equal, round, and reactive to light.  Neck:     Thyroid : No thyroid  mass or thyromegaly.  Cardiovascular:     Rate and Rhythm: Normal rate and regular rhythm. Normal heart sounds. Normal peripheral pulses Pulmonary:     Normal breath sounds with normal effort Abdominal:   Abdomen is soft, without tenderness or noted hepatosplenomegaly Musculoskeletal:        General: No swelling or edema  Lymphadenopathy:     Cervical: No cervical adenopathy.  Skin:    General: Skin is warm and dry without noticeable rash. Neurological:     General: No focal deficit present.  Psychiatric:        Mood and Affect: Mood, behavior and cognition normal   Past Medical History:  Diagnosis Date   Syncope    suspect vesovagal. normal ECG and QTc. echo 10/11: EF 55-60%, normal diastolic dysfunction, normal wall motion, no significant valvular abnm., no pulmonary htn, normal appearing RV. holter 10/11; occassional eptopic atrial rhythm with rate in 50s, no worrisome events, average HR 83.   Past Surgical History:  Procedure Laterality Date   CESAREAN SECTION  08/26/2012   CESAREAN SECTION N/A 01/05/2018   Procedure: REPEAT CESAREAN SECTION WITH BILATERAL TUBALIGATION;  Surgeon: Janit Alm Agent, MD;  Location: [facility] ORS;  Service: Obstetrics;  Laterality: N/A;    TONSILLECTOMY     TUBAL LIGATION     WISDOM TOOTH EXTRACTION     Family Status  Relation Name Status   Mother  Alive   Father  Alive   Mat Aunt  (Not Specified)   Mat Uncle  (Not Specified)   MGM  Deceased   MGF  Deceased   PGM  Alive   PGF  Alive   Other  (Not Specified)   Neg Hx  (Not Specified)  No partnership data on file   Family History  Problem Relation Age of Onset   Colon cancer Maternal Aunt    Breast cancer Maternal Aunt    Colon cancer Maternal Uncle    Heart disease Other        GF - heart trouble    Coronary artery disease Neg Hx        congenital heart disease, sudden death    Ovarian cancer Neg Hx    Social History   Socioeconomic History   Marital status: Married    Spouse name: Not on file   Number of children: Not on  file   Years of education: Not on file   Highest education level: Master's degree (e.g., MA, MS, MEng, MEd, MSW, MBA)  Occupational History   Not on file  Tobacco Use   Smoking status: Never   Smokeless tobacco: Never   Tobacco comments:    non smoker  Vaping Use   Vaping status: Never Used  Substance and Sexual Activity   Alcohol use: No   Drug use: No   Sexual activity: Yes    Birth control/protection: None, Surgical    Comment: Tubal ligation  Other Topics Concern   Not on file  Social History Narrative   Married, full time - Pharmacologist. Does not get regular exercise.    Social Drivers of Health   Tobacco Use: Low Risk (09/03/2024)   Patient History    Smoking Tobacco Use: Never    Smokeless Tobacco Use: Never    Passive Exposure: Not on file  Financial Resource Strain: Low Risk (08/30/2024)   Overall Financial Resource Strain (CARDIA)    Difficulty of Paying Living Expenses: Not hard at all  Food Insecurity: No Food Insecurity (08/30/2024)   Epic    Worried About Programme Researcher, Broadcasting/film/video in the Last Year: Never true    Ran Out of Food in the Last Year: Never true  Transportation Needs: No  Transportation Needs (08/30/2024)   Epic    Lack of Transportation (Medical): No    Lack of Transportation (Non-Medical): No  Physical Activity: Insufficiently Active (08/30/2024)   Exercise Vital Sign    Days of Exercise per Week: 2 days    Minutes of Exercise per Session: 30 min  Stress: No Stress Concern Present (08/30/2024)   [employer] of Occupational Health - Occupational Stress Questionnaire    Feeling of Stress: Only a little  Social Connections: Socially Integrated (08/30/2024)   Social Connection and Isolation Panel    Frequency of Communication with Friends and Family: More than three times a week    Frequency of Social Gatherings with Friends and Family: More than three times a week    Attends Religious Services: More than 4 times per year    Active Member of Clubs or Organizations: Yes    Attends Banker Meetings: More than 4 times per year    Marital Status: Married  Depression (PHQ2-9): Low Risk (09/03/2024)   Depression (PHQ2-9)    PHQ-2 Score: 0  Alcohol Screen: Not on file  Housing: Unknown (08/30/2024)   Epic    Unable to Pay for Housing in the Last Year: No    Number of Times Moved in the Last Year: Not on file    Homeless in the Last Year: No  Utilities: Not on file  Health Literacy: Not on file   Show/hide medication list[1] Allergies[2]  Immunization History  Administered Date(s) Administered   Tdap 06/06/2017    Health Maintenance  Topic Date Due   Hepatitis B Vaccines 19-59 Average Risk (1 of 3 - 19+ 3-dose series) Never done   HPV VACCINES (1 - 3-dose SCDM series) Never done   Mammogram  10/25/2023   COVID-19 Vaccine (1 - 2025-26 season) 09/19/2024 (Originally 04/26/2024)   Influenza Vaccine  11/23/2024 (Originally 03/26/2024)   Cervical Cancer Screening (HPV/Pap Cotest)  09/27/2026   DTaP/Tdap/Td (2 - Td or Tdap) 06/07/2027   Hepatitis C Screening  Completed   HIV Screening  Completed   Pneumococcal Vaccine  Aged Out   Meningococcal B  Vaccine  Aged  Out    Patient Care Team: Pcp, No as PCP - General  Depression Screen    09/03/2024   10:18 AM 04/30/2024    1:44 PM 06/27/2023    9:12 AM  PHQ 2/9 Scores  PHQ - 2 Score 0 0 0  PHQ- 9 Score 0  0      Data saved with a previous flowsheet row definition     Mallory DELENA Juneau, MD  [facility] [phone] (phone) [phone] (fax)  [facility]    [1]  Outpatient Medications Prior to Visit  Medication Sig   Multiple Vitamins-Minerals (WOMENS MULTI PO) Take 1 tablet by mouth daily.   No facility-administered medications prior to visit.  [2] No Known Allergies

## 2024-09-04 LAB — URINALYSIS, ROUTINE W REFLEX MICROSCOPIC
Bilirubin Urine: NEGATIVE
Glucose, UA: NEGATIVE
Hgb urine dipstick: NEGATIVE
Ketones, ur: NEGATIVE
Leukocytes,Ua: NEGATIVE
Nitrite: NEGATIVE
Protein, ur: NEGATIVE
Specific Gravity, Urine: 1.009 (ref 1.001–1.035)
pH: 6 (ref 5.0–8.0)

## 2024-09-04 LAB — CBC WITH DIFFERENTIAL/PLATELET
Absolute Lymphocytes: 2327 {cells}/uL (ref 850–3900)
Absolute Monocytes: 479 {cells}/uL (ref 200–950)
Basophils Absolute: 50 {cells}/uL (ref 0–200)
Basophils Relative: 0.6 %
Eosinophils Absolute: 151 {cells}/uL (ref 15–500)
Eosinophils Relative: 1.8 %
HCT: 43.3 % (ref 35.9–46.0)
Hemoglobin: 14.3 g/dL (ref 11.7–15.5)
MCH: 30.4 pg (ref 27.0–33.0)
MCHC: 33 g/dL (ref 31.6–35.4)
MCV: 91.9 fL (ref 81.4–101.7)
MPV: 10.1 fL (ref 7.5–12.5)
Monocytes Relative: 5.7 %
Neutro Abs: 5393 {cells}/uL (ref 1500–7800)
Neutrophils Relative %: 64.2 %
Platelets: 332 Thousand/uL (ref 140–400)
RBC: 4.71 Million/uL (ref 3.80–5.10)
RDW: 12.1 % (ref 11.0–15.0)
Total Lymphocyte: 27.7 %
WBC: 8.4 Thousand/uL (ref 3.8–10.8)

## 2024-09-04 LAB — COMPREHENSIVE METABOLIC PANEL WITH GFR
AG Ratio: 1.5 (calc) (ref 1.0–2.5)
ALT: 22 U/L (ref 6–29)
AST: 19 U/L (ref 10–30)
Albumin: 4.3 g/dL (ref 3.6–5.1)
Alkaline phosphatase (APISO): 44 U/L (ref 31–125)
BUN: 12 mg/dL (ref 7–25)
CO2: 26 mmol/L (ref 20–32)
Calcium: 10.2 mg/dL (ref 8.6–10.2)
Chloride: 107 mmol/L (ref 98–110)
Creat: 0.7 mg/dL (ref 0.50–0.99)
Globulin: 2.8 g/dL (ref 1.9–3.7)
Glucose, Bld: 91 mg/dL (ref 65–99)
Potassium: 4.4 mmol/L (ref 3.5–5.3)
Sodium: 140 mmol/L (ref 135–146)
Total Bilirubin: 0.6 mg/dL (ref 0.2–1.2)
Total Protein: 7.1 g/dL (ref 6.1–8.1)
eGFR: 110 mL/min/1.73m2

## 2024-09-04 LAB — C-REACTIVE PROTEIN: CRP: <3 mg/L

## 2024-09-04 LAB — LIPID PANEL
Cholesterol: 195 mg/dL
HDL: 71 mg/dL
LDL Cholesterol (Calc): 105 mg/dL — ABNORMAL HIGH
Non-HDL Cholesterol (Calc): 124 mg/dL
Total CHOL/HDL Ratio: 2.7 (calc)
Triglycerides: 101 mg/dL

## 2024-09-04 LAB — B12 AND FOLATE PANEL
Folate: 22 ng/mL
Vitamin B-12: 363 pg/mL (ref 200–1100)

## 2024-09-04 LAB — IRON,TIBC AND FERRITIN PANEL
%SAT: 41 % (ref 16–45)
Ferritin: 26 ng/mL (ref 16–232)
Iron: 141 ug/dL (ref 40–190)
TIBC: 343 ug/dL (ref 250–450)

## 2024-09-04 LAB — HEMOGLOBIN A1C
Hgb A1c MFr Bld: 5.2 %
Mean Plasma Glucose: 103 mg/dL
eAG (mmol/L): 5.7 mmol/L

## 2024-09-04 LAB — TSH+FREE T4: TSH W/REFLEX TO FT4: 0.82 m[IU]/L

## 2024-09-17 ENCOUNTER — Ambulatory Visit (INDEPENDENT_AMBULATORY_CARE_PROVIDER_SITE_OTHER)

## 2024-09-17 VITALS — BP 110/78 | HR 91 | Ht 65.0 in | Wt 213.4 lb

## 2024-09-17 DIAGNOSIS — R55 Syncope and collapse: Secondary | ICD-10-CM

## 2024-09-17 DIAGNOSIS — R0609 Other forms of dyspnea: Secondary | ICD-10-CM

## 2024-09-17 NOTE — Progress Notes (Signed)
 "           Progress Note  Physician: Parris DELENA Juneau, MD   HPI: Mallory Clayton is a 44 y.o. female presenting on 09/17/2024 for Follow-up .  Discussed the use of AI scribe software for clinical note transcription with the patient, who gave verbal consent to proceed.  History of Present Illness   Mallory Clayton is a 44 year old female who presents with chronic shortness of breath and edema.  Dyspnea - Chronic shortness of breath, stable over time - Occurs with exertion, not present when inactive or supine - No associated palpitations, cough, or wheezing - Avoids activities due to symptoms  Lower extremity edema - Pitting edema, first noticed last spring, intermittent.  Labs show normal renal function  Vasovagal syncope  - History of vasovagal episodes, particularly when ill - Episodes have been occurring for years and have worsened over time - Vasovagal episodes predate COVID infection  Prior diagnostic evaluation - Normal kidney and liver function tests - Normal cholesterol and iron levels - No anemia - Normal thyroid  function tests - Chest x-ray performed in November after pneumonia - Holter monitor test completed  Social history relevant to presenting symptoms - No history of smoking         Medical history:  Relevant past medical, surgical, family and social history reviewed and updated as indicated. Interim medical history since our last visit reviewed.  Allergies and medications reviewed and updated.   ROS: Negative unless specifically indicated above in HPI.   Current Medications[1]       Objective:     BP 110/78 (BP Location: Right Arm, Patient Position: Sitting, Cuff Size: Large)   Pulse 91   Ht 5' 5 (1.651 m)   Wt 213 lb 6.4 oz (96.8 kg)   LMP 09/09/2024   SpO2 97%   BMI 35.51 kg/m   Wt Readings from Last 3 Encounters:  09/17/24 213 lb 6.4 oz (96.8 kg)  09/03/24 213 lb 12.8 oz (97 kg)  04/30/24 204 lb 11.2 oz (92.9 kg)     Physical Exam  Physical Exam Vitals reviewed.  Constitutional:      Appearance: Normal appearance. Well-developed with normal weight.  Cardiovascular:     Rate and Rhythm: Normal rate and regular rhythm. Normal heart sounds. Normal peripheral pulses Pulmonary:     Normal breath sounds with normal effort Skin:    General: Skin is warm and dry without noticeable rash. Neurological:     General: No focal deficit present.  Psychiatric:        Mood and Affect: Mood, behavior and cognition normal      Assessment & Plan:  No diagnosis found.  No orders of the defined types were placed in this encounter.    Assessment and Plan  #1 chronic shortness of breath.  EKG done today which was normal.  She did have an echocardiogram in the past but we will order a repeat since its was done a number of years ago.  Given her recurrent syncope important to rule out any underlying cardiac issues I will refer her to cardiology for further evaluation.  Pulmonary etiology is not completely ruled out she did have a normal chest x-ray and we have no clinical signs or symptoms to indicate pulmonary issues.  We could do a pulmonary function test if there are concerns after cardiac evaluation is done.    2.  Pitting edema.  Mild to trace edema today.  May or may  not be related to primary issue and shortness of breath.  3.  Recurrent syncope.  This seems vasovagal but we would like confirmation particularly in the setting.    Discussed with patient that we will follow-up with her post cardiology evaluation.  Will query to staff as why labs are missing.                  [1]  Current Outpatient Medications:    Multiple Vitamins-Minerals (WOMENS MULTI PO), Take 1 tablet by mouth daily., Disp: , Rfl:   "
